# Patient Record
Sex: Male | Born: 1970 | Race: White | Hispanic: No | Smoking: Current every day smoker
Health system: Southern US, Community
[De-identification: ages and names within clinical notes are randomized; demographics above are authoritative.]

## PROBLEM LIST (undated history)

## (undated) DIAGNOSIS — J42 Unspecified chronic bronchitis: Secondary | ICD-10-CM

## (undated) DIAGNOSIS — M1990 Unspecified osteoarthritis, unspecified site: Secondary | ICD-10-CM

## (undated) DIAGNOSIS — R011 Cardiac murmur, unspecified: Secondary | ICD-10-CM

## (undated) DIAGNOSIS — I456 Pre-excitation syndrome: Secondary | ICD-10-CM

## (undated) DIAGNOSIS — K219 Gastro-esophageal reflux disease without esophagitis: Secondary | ICD-10-CM

## (undated) HISTORY — DX: Cardiac murmur, unspecified: R01.1

## (undated) HISTORY — PX: MOLE REMOVAL: SHX2046

## (undated) HISTORY — DX: Gastro-esophageal reflux disease without esophagitis: K21.9

---

## 2006-05-23 ENCOUNTER — Ambulatory Visit: Payer: Self-pay | Admitting: Family Medicine

## 2013-01-20 ENCOUNTER — Encounter: Payer: Self-pay | Admitting: Family Medicine

## 2013-01-20 ENCOUNTER — Ambulatory Visit (INDEPENDENT_AMBULATORY_CARE_PROVIDER_SITE_OTHER): Payer: 59 | Admitting: Family Medicine

## 2013-01-20 ENCOUNTER — Encounter (INDEPENDENT_AMBULATORY_CARE_PROVIDER_SITE_OTHER): Payer: Self-pay

## 2013-01-20 VITALS — BP 121/69 | HR 66 | Temp 98.6°F | Ht 74.0 in | Wt 217.0 lb

## 2013-01-20 DIAGNOSIS — N390 Urinary tract infection, site not specified: Secondary | ICD-10-CM

## 2013-01-20 DIAGNOSIS — R300 Dysuria: Secondary | ICD-10-CM

## 2013-01-20 DIAGNOSIS — A630 Anogenital (venereal) warts: Secondary | ICD-10-CM

## 2013-01-20 DIAGNOSIS — Z0000 Encounter for general adult medical examination without abnormal findings: Secondary | ICD-10-CM

## 2013-01-20 DIAGNOSIS — N451 Epididymitis: Secondary | ICD-10-CM

## 2013-01-20 DIAGNOSIS — N453 Epididymo-orchitis: Secondary | ICD-10-CM

## 2013-01-20 LAB — POCT UA - MICROSCOPIC ONLY
Casts, Ur, LPF, POC: NEGATIVE
Crystals, Ur, HPF, POC: NEGATIVE
Mucus, UA: NEGATIVE
RBC, urine, microscopic: NEGATIVE
Yeast, UA: NEGATIVE

## 2013-01-20 LAB — POCT UA - MICROALBUMIN: Microalbumin Ur, POC: NEGATIVE mg/L

## 2013-01-20 MED ORDER — CIPROFLOXACIN HCL 500 MG PO TABS: 500.0000 mg | ORAL_TABLET | Freq: Two times a day (BID) | ORAL | Status: DC

## 2013-01-20 MED ORDER — IMIQUIMOD 5 % EX CREA: TOPICAL_CREAM | CUTANEOUS | Status: DC

## 2013-01-20 NOTE — Progress Notes (Signed)
   Subjective:    Patient ID: Casey Brown, male    DOB: October 31, 1970, 42 y.o.   MRN: [id number]  HPI  This 42 y.o. male presents for evaluation of CPE.  He is having some skin tag on  His groin.  He has been having right testicular swelling and pain. He also is c/o Dysuria.  He has been having some dysuria for 3 days along with some fever. He has been having right testicular swelling..  Review of Systems No chest pain, SOB, HA, dizziness, vision change, N/V, diarrhea, constipation, dysuria, urinary urgency or frequency, myalgias, arthralgias or rash.     Objective:   Physical Exam  Vital signs noted  Well developed well nourished male.  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  Right teste swollen and tender. Left teste normal.  Penis normal w/o discharge and circumcised. Rectal - Declines Extremities - No edema. Neuro - Grossly intact.      Assessment & Plan:  Routine general medical examination at a health care facility - Plan: POCT CBC, CMP14+EGFR, PSA, total and free, Lipid panel, Thyroid Panel With TSH, POCT UA - Microalbumin, POCT UA - Microscopic Only  Dysuria - Plan: POCT UA - Microalbumin, POCT UA - Microscopic Only  UTI (lower urinary tract infection) - Plan: POCT UA - Microalbumin, POCT UA - Microscopic Only, ciprofloxacin (CIPRO) 500 MG tablet  Epididymitis - Cipro 500mg  one po bid x 2 weeks #28.  Explained needs to follow up in 2 weeks or prn.  Condyloma - Plan: imiquimod (ALDARA) 5 % cream  Apply 3 x week and then rest a week   Deatra Canter FNP

## 2013-01-20 NOTE — Patient Instructions (Signed)
Epididymitis  Epididymitis is a swelling (inflammation) of the epididymis. The epididymis is a cord-like structure along the back part of the testicle. Epididymitis is usually, but not always, caused by infection. This is usually a sudden problem beginning with chills, fever and pain behind the scrotum and in the testicle. There may be swelling and redness of the testicle.  DIAGNOSIS   Physical examination will reveal a tender, swollen epididymis. Sometimes, cultures are obtained from the urine or from prostate secretions to help find out if there is an infection or if the cause is a different problem. Sometimes, blood work is performed to see if your white blood cell count is elevated and if a germ (bacterial) or viral infection is present. Using this knowledge, an appropriate medicine which kills germs (antibiotic) can be chosen by your caregiver. A viral infection causing epididymitis will most often go away (resolve) without treatment.  HOME CARE INSTRUCTIONS   · Hot sitz baths for 20 minutes, 4 times per day, may help relieve pain.  · Only take over-the-counter or prescription medicines for pain, discomfort or fever as directed by your caregiver.  · Take all medicines, including antibiotics, as directed. Take the antibiotics for the full prescribed length of time even if you are feeling better.  · It is very important to keep all follow-up appointments.  SEEK IMMEDIATE MEDICAL CARE IF:   · You have a fever.  · You have pain not relieved with medicines.  · You have any worsening of your problems.  · Your pain seems to come and go.  · You develop pain, redness, and swelling in the scrotum and surrounding areas.  MAKE SURE YOU:   · Understand these instructions.  · Will watch your condition.  · Will get help right away if you are not doing well or get worse.  Document Released: 02/04/2000 Document Revised: 05/01/2011 Document Reviewed: 12/24/2008  Exit[employer]® Patient Information ©2014 [employer].

## 2013-01-20 NOTE — Addendum Note (Signed)
Addended by: Orma Render F on: 01/20/2013 11:55 AM   Modules accepted: Orders

## 2013-01-21 LAB — CBC WITH DIFFERENTIAL
Basophils Absolute: 0 10*3/uL (ref 0.0–0.2)
Basos: 1 %
Eos: 3 %
Eosinophils Absolute: 0.2 10*3/uL (ref 0.0–0.4)
HCT: 42 % (ref 37.5–51.0)
Hemoglobin: 14.4 g/dL (ref 12.6–17.7)
Immature Grans (Abs): 0.1 10*3/uL (ref 0.0–0.1)
Immature Granulocytes: 1 %
Lymphocytes Absolute: 2 10*3/uL (ref 0.7–3.1)
Lymphs: 33 %
MCH: 30.1 pg (ref 26.6–33.0)
MCHC: 34.3 g/dL (ref 31.5–35.7)
MCV: 88 fL (ref 79–97)
Monocytes Absolute: 0.6 10*3/uL (ref 0.1–0.9)
Monocytes: 9 %
Neutrophils Absolute: 3.2 10*3/uL (ref 1.4–7.0)
Neutrophils Relative %: 53 %
Platelets: 434 10*3/uL — ABNORMAL HIGH (ref 150–379)
RBC: 4.78 x10E6/uL (ref 4.14–5.80)
RDW: 12.7 % (ref 12.3–15.4)
WBC: 6.1 10*3/uL (ref 3.4–10.8)

## 2013-01-21 LAB — CMP14+EGFR
ALT: 23 IU/L (ref 0–44)
AST: 23 IU/L (ref 0–40)
Albumin/Globulin Ratio: 1.4 (ref 1.1–2.5)
Albumin: 4.3 g/dL (ref 3.5–5.5)
Alkaline Phosphatase: 83 IU/L (ref 39–117)
BUN/Creatinine Ratio: 8 — ABNORMAL LOW (ref 9–20)
BUN: 9 mg/dL (ref 6–24)
CO2: 25 mmol/L (ref 18–29)
Calcium: 9.8 mg/dL (ref 8.7–10.2)
Chloride: 104 mmol/L (ref 97–108)
Creatinine, Ser: 1.16 mg/dL (ref 0.76–1.27)
GFR calc Af Amer: 91 mL/min/{1.73_m2} (ref >59)
GFR calc non Af Amer: 78 mL/min/{1.73_m2} (ref >59)
Globulin, Total: 3.1 g/dL (ref 1.5–4.5)
Glucose: 90 mg/dL (ref 65–99)
Potassium: 4.8 mmol/L (ref 3.5–5.2)
Sodium: 144 mmol/L (ref 134–144)
Total Bilirubin: 0.3 mg/dL (ref 0.0–1.2)
Total Protein: 7.4 g/dL (ref 6.0–8.5)

## 2013-01-21 LAB — PSA, TOTAL AND FREE
PSA, Free Pct: 7.9 %
PSA, Free: 0.31 ng/mL
PSA: 3.9 ng/mL (ref 0.0–4.0)

## 2013-01-21 LAB — LIPID PANEL
Chol/HDL Ratio: 4.9 ratio units (ref 0.0–5.0)
Cholesterol, Total: 143 mg/dL (ref 100–199)
HDL: 29 mg/dL — ABNORMAL LOW (ref >39)
LDL Calculated: 87 mg/dL (ref 0–99)
Triglycerides: 133 mg/dL (ref 0–149)
VLDL Cholesterol Cal: 27 mg/dL (ref 5–40)

## 2013-01-21 LAB — THYROID PANEL WITH TSH
Free Thyroxine Index: 2.7 (ref 1.2–4.9)
T3 Uptake Ratio: 28 % (ref 24–39)
T4, Total: 9.7 ug/dL (ref 4.5–12.0)
TSH: 0.859 u[IU]/mL (ref 0.450–4.500)

## 2014-01-20 ENCOUNTER — Telehealth: Payer: Self-pay | Admitting: Family Medicine

## 2014-01-20 NOTE — Telephone Encounter (Signed)
Patient notified we have no available appointments for a physical before the end of the year.

## 2015-01-01 ENCOUNTER — Encounter: Payer: 59 | Admitting: Family Medicine

## 2015-01-07 ENCOUNTER — Encounter: Payer: 59 | Admitting: Family Medicine

## 2015-01-08 ENCOUNTER — Encounter: Payer: Self-pay | Admitting: Family Medicine

## 2018-02-04 ENCOUNTER — Encounter (HOSPITAL_COMMUNITY): Payer: Self-pay | Admitting: Emergency Medicine

## 2018-02-04 ENCOUNTER — Inpatient Hospital Stay (HOSPITAL_COMMUNITY)
Admission: EM | Admit: 2018-02-04 | Discharge: 2018-02-08 | DRG: 274 | Disposition: A | Discharge status: Home / Self Care | Payer: Managed Care, Other (non HMO) | Attending: Internal Medicine | Admitting: Internal Medicine

## 2018-02-04 ENCOUNTER — Other Ambulatory Visit: Payer: Self-pay

## 2018-02-04 ENCOUNTER — Emergency Department (INDEPENDENT_AMBULATORY_CARE_PROVIDER_SITE_OTHER): Payer: Managed Care, Other (non HMO)

## 2018-02-04 ENCOUNTER — Emergency Department (HOSPITAL_COMMUNITY): Payer: Managed Care, Other (non HMO)

## 2018-02-04 DIAGNOSIS — Z79899 Other long term (current) drug therapy: Secondary | ICD-10-CM | POA: Diagnosis not present

## 2018-02-04 DIAGNOSIS — I472 Ventricular tachycardia, unspecified: Secondary | ICD-10-CM

## 2018-02-04 DIAGNOSIS — R0602 Shortness of breath: Secondary | ICD-10-CM | POA: Diagnosis not present

## 2018-02-04 DIAGNOSIS — I456 Pre-excitation syndrome: Secondary | ICD-10-CM | POA: Diagnosis present

## 2018-02-04 DIAGNOSIS — I4891 Unspecified atrial fibrillation: Secondary | ICD-10-CM | POA: Diagnosis present

## 2018-02-04 DIAGNOSIS — Z7982 Long term (current) use of aspirin: Secondary | ICD-10-CM

## 2018-02-04 DIAGNOSIS — F1410 Cocaine abuse, uncomplicated: Secondary | ICD-10-CM | POA: Diagnosis not present

## 2018-02-04 DIAGNOSIS — R079 Chest pain, unspecified: Secondary | ICD-10-CM | POA: Diagnosis present

## 2018-02-04 DIAGNOSIS — J42 Unspecified chronic bronchitis: Secondary | ICD-10-CM | POA: Diagnosis not present

## 2018-02-04 DIAGNOSIS — I498 Other specified cardiac arrhythmias: Secondary | ICD-10-CM | POA: Diagnosis present

## 2018-02-04 DIAGNOSIS — F17210 Nicotine dependence, cigarettes, uncomplicated: Secondary | ICD-10-CM | POA: Diagnosis present

## 2018-02-04 DIAGNOSIS — R011 Cardiac murmur, unspecified: Secondary | ICD-10-CM | POA: Diagnosis present

## 2018-02-04 DIAGNOSIS — K219 Gastro-esophageal reflux disease without esophagitis: Secondary | ICD-10-CM | POA: Diagnosis not present

## 2018-02-04 HISTORY — DX: Pre-excitation syndrome: I45.6

## 2018-02-04 HISTORY — DX: Unspecified chronic bronchitis: J42

## 2018-02-04 HISTORY — DX: Unspecified osteoarthritis, unspecified site: M19.90

## 2018-02-04 LAB — CBC WITH DIFFERENTIAL/PLATELET
Abs Immature Granulocytes: 0.04 10*3/uL (ref 0.00–0.07)
Basophils Absolute: 0 10*3/uL (ref 0.0–0.1)
Basophils Relative: 1 %
Eosinophils Absolute: 0.2 10*3/uL (ref 0.0–0.5)
Eosinophils Relative: 4 %
HEMATOCRIT: 46.1 % (ref 39.0–52.0)
HEMOGLOBIN: 15.3 g/dL (ref 13.0–17.0)
Immature Granulocytes: 1 %
Lymphocytes Relative: 37 %
Lymphs Abs: 2.3 10*3/uL (ref 0.7–4.0)
MCH: 29.8 pg (ref 26.0–34.0)
MCHC: 33.2 g/dL (ref 30.0–36.0)
MCV: 89.9 fL (ref 80.0–100.0)
Monocytes Absolute: 0.9 10*3/uL (ref 0.1–1.0)
Monocytes Relative: 14 %
NRBC: 0 % (ref 0.0–0.2)
Neutro Abs: 2.7 10*3/uL (ref 1.7–7.7)
Neutrophils Relative %: 43 %
Platelets: 317 10*3/uL (ref 150–400)
RBC: 5.13 MIL/uL (ref 4.22–5.81)
RDW: 13.1 % (ref 11.5–15.5)
WBC: 6.3 10*3/uL (ref 4.0–10.5)

## 2018-02-04 LAB — I-STAT CHEM 8, ED
BUN: 12 mg/dL (ref 6–20)
CREATININE: 1.2 mg/dL (ref 0.61–1.24)
Calcium, Ion: 1.24 mmol/L (ref 1.15–1.40)
Chloride: 107 mmol/L (ref 98–111)
Glucose, Bld: 87 mg/dL (ref 70–99)
HCT: 46 % (ref 39.0–52.0)
Hemoglobin: 15.6 g/dL (ref 13.0–17.0)
Potassium: 4.1 mmol/L (ref 3.5–5.1)
Sodium: 141 mmol/L (ref 135–145)
TCO2: 24 mmol/L (ref 22–32)

## 2018-02-04 LAB — RAPID URINE DRUG SCREEN, HOSP PERFORMED
Amphetamines: NOT DETECTED
Barbiturates: NOT DETECTED
Benzodiazepines: NOT DETECTED
Cocaine: NOT DETECTED
Opiates: NOT DETECTED
Tetrahydrocannabinol: NOT DETECTED

## 2018-02-04 LAB — CBG MONITORING, ED: Glucose-Capillary: 90 mg/dL (ref 70–99)

## 2018-02-04 LAB — COMPREHENSIVE METABOLIC PANEL
ALK PHOS: 63 U/L (ref 38–126)
ALT: 21 U/L (ref 0–44)
AST: 26 U/L (ref 15–41)
Albumin: 4.1 g/dL (ref 3.5–5.0)
Anion gap: 6 (ref 5–15)
BUN: 13 mg/dL (ref 6–20)
CO2: 25 mmol/L (ref 22–32)
Calcium: 9.3 mg/dL (ref 8.9–10.3)
Chloride: 108 mmol/L (ref 98–111)
Creatinine, Ser: 1.15 mg/dL (ref 0.61–1.24)
GFR calc Af Amer: >60 mL/min (ref >60)
GFR calc non Af Amer: >60 mL/min (ref >60)
Glucose, Bld: 88 mg/dL (ref 70–99)
Potassium: 3.9 mmol/L (ref 3.5–5.1)
Sodium: 139 mmol/L (ref 135–145)
Total Bilirubin: 1.2 mg/dL (ref 0.3–1.2)
Total Protein: 7.6 g/dL (ref 6.5–8.1)

## 2018-02-04 LAB — TSH: TSH: 1.999 u[IU]/mL (ref 0.350–4.500)

## 2018-02-04 LAB — MAGNESIUM: Magnesium: 2.2 mg/dL (ref 1.7–2.4)

## 2018-02-04 LAB — I-STAT TROPONIN, ED
Troponin i, poc: 0 ng/mL (ref 0.00–0.08)
Troponin i, poc: 0.05 ng/mL (ref 0.00–0.08)

## 2018-02-04 LAB — ECHOCARDIOGRAM COMPLETE
Height: 74 in
Weight: 3440 oz

## 2018-02-04 LAB — TROPONIN I: Troponin I: <0.03 ng/mL (ref <0.03)

## 2018-02-04 MED ORDER — ETOMIDATE 2 MG/ML IV SOLN
INTRAVENOUS | Status: AC | PRN
  Administered 2018-02-04: 20 mg via INTRAVENOUS

## 2018-02-04 MED ORDER — ASPIRIN 81 MG PO CHEW
324.0000 mg | CHEWABLE_TABLET | Freq: Once | ORAL | Status: AC
  Administered 2018-02-04: 324 mg via ORAL
  Filled 2018-02-04: qty 4

## 2018-02-04 MED ORDER — FENTANYL CITRATE (PF) 100 MCG/2ML IJ SOLN: 25.0000 ug | Freq: Once | INTRAMUSCULAR | Status: DC

## 2018-02-04 MED ORDER — ADENOSINE 6 MG/2ML IV SOLN
INTRAVENOUS | Status: AC
  Filled 2018-02-04: qty 8

## 2018-02-04 MED ORDER — FENTANYL CITRATE (PF) 100 MCG/2ML IJ SOLN
INTRAMUSCULAR | Status: AC | PRN
  Administered 2018-02-04: 25 ug via INTRAVENOUS

## 2018-02-04 MED ORDER — ETOMIDATE 2 MG/ML IV SOLN: 20.0000 mg | Freq: Once | INTRAVENOUS | Status: DC

## 2018-02-04 MED ORDER — FENTANYL CITRATE (PF) 100 MCG/2ML IJ SOLN
INTRAMUSCULAR | Status: AC
  Filled 2018-02-04: qty 2

## 2018-02-04 MED ORDER — SODIUM CHLORIDE 0.9 % IV BOLUS: 500.0000 mL | Freq: Once | INTRAVENOUS | Status: DC

## 2018-02-04 MED ORDER — SODIUM CHLORIDE 0.9 % IV BOLUS (SEPSIS)
1000.0000 mL | Freq: Once | INTRAVENOUS | Status: AC
  Administered 2018-02-04: 1000 mL via INTRAVENOUS

## 2018-02-04 MED ORDER — ETOMIDATE 2 MG/ML IV SOLN
INTRAVENOUS | Status: AC
  Filled 2018-02-04: qty 10

## 2018-02-04 MED ORDER — FENTANYL CITRATE (PF) 100 MCG/2ML IJ SOLN
25.0000 ug | INTRAMUSCULAR | Status: DC | PRN
  Administered 2018-02-06 – 2018-02-07 (×2): 25 ug via INTRAVENOUS
  Filled 2018-02-04 (×2): qty 2

## 2018-02-04 MED ORDER — SODIUM CHLORIDE 0.9 % IV SOLN: INTRAVENOUS | Status: AC | PRN

## 2018-02-04 NOTE — Progress Notes (Signed)
Patient arrived from [facility]. Some chest soreness after cardioversion. Otherwise, no discomfort. 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

## 2018-02-04 NOTE — ED Triage Notes (Signed)
PT c/o middle, upper neck pain that started last night with SOB. EKG done with high heart rate.

## 2018-02-04 NOTE — H&P (Signed)
Cardiology Consultation:   Patient ID: Casey [name] [place] MRN: [id number]; DOB: 03/11/1970  Admit date: 02/04/2018 Date of Consult: 02/04/2018  Primary Care Provider: Junie [name], Christy A, FNP Primary Cardiologist: New Primary Electrophysiologist:  New   Patient Profile:   Casey [name] Junio is a 47 y.o. male with a hx of substance abuse who is being seen today for the evaluation of chest pain at the request of Dr Casey [name].  History of Present Illness:   Mr. Casey [name] 47 yo male with no significant PMH but history of cocaine use presents with chest pain. He reporst long history of intermittent chest pains and palpitations, usually infrequent and lasting just a few minutes. Starting last night around 8pm had severe symptoms of palpitations and chest tightness lasting several hours at a time, much more intense and extended in duration compared to prior episodes.   In ER found to be in wide complex rhythm, he was cardioverted by ER staff   Trop neg K 4.1 Cr 1.2 WBC 6.3 Hgb 15.3 Plt 317 Mg pending Initial EKG  Past Medical History:  Diagnosis Date  . GERD (gastroesophageal reflux disease)   . Heart murmur     Past Surgical History:  Procedure Laterality Date  . MOLE REMOVAL       Inpatient Medications: Scheduled Meds: . adenosine      . etomidate  20 mg Intravenous Once  . fentaNYL (SUBLIMAZE) injection  25 mcg Intravenous Once   Continuous Infusions: . sodium chloride     PRN Meds: fentaNYL (SUBLIMAZE) injection  Allergies:   No Known Allergies  Social History:   Social History   Socioeconomic History  . Marital status: Single    Spouse name: Not on file  . Number of children: Not on file  . Years of education: Not on file  . Highest education level: Not on file  Occupational History  . Not on file  Social Needs  . Financial resource strain: Not on file  . Food insecurity:    Worry: Not on file    Inability: Not on file  . Transportation needs:    Medical: Not on file   Non-medical: Not on file  Tobacco Use  . Smoking status: Current Every Day Smoker    Packs/day: 0.50    Types: Cigarettes    Start date: 01/20/1993  . Smokeless tobacco: Never Used  Substance and Sexual Activity  . Alcohol use: Yes    Alcohol/week: 12.0 standard drinks    Types: 12 Standard drinks or equivalent per week    Comment: daily 4 beers  . Drug use: Yes    Types: Cocaine    Comment: cociane 2 weeks ago  . Sexual activity: Not on file  Lifestyle  . Physical activity:    Days per week: Not on file    Minutes per session: Not on file  . Stress: Not on file  Relationships  . Social connections:    Talks on phone: Not on file    Gets together: Not on file    Attends religious service: Not on file    Active member of club or organization: Not on file    Attends meetings of clubs or organizations: Not on file    Relationship status: Not on file  . Intimate partner violence:    Fear of current or ex partner: Not on file    Emotionally abused: Not on file    Physically abused: Not on file    Forced sexual activity: Not on file  Other Topics Concern  . Not on file  Social History Narrative  . Not on file    Family History:   Reports history of "heart disease" in aunts and cousing, no 1st degree relatives. No FH of young onset heart disease.   ROS:  Please see the history of present illness.  All other ROS reviewed and negative.     Physical Exam/Data:   Vitals:   02/04/18 1015 02/04/18 1017 02/04/18 1030 02/04/18 1045  BP: 126/87 126/87 119/79 111/83  Pulse: 71 60 66 65  Resp: 15 18 19 17   Temp:      TempSrc:      SpO2: 100% 100% 100% 100%  Weight:      Height:       No intake or output data in the 24 hours ending 02/04/18 1101 Filed Weights   02/04/18 0956  Weight: 97.5 kg   Body mass index is 27.6 kg/m.  General:  Well nourished, well developed, in no acute distress HEENT: normal Lymph: no adenopathy Neck: no JVD Endocrine:  No  thryomegaly Vascular: No carotid bruits; FA pulses 2+ bilaterally without bruits  Cardiac:  normal S1, S2; RRR; no murmur Lungs:  clear to auscultation bilaterally, no wheezing, rhonchi or rales  Abd: soft, nontender, no hepatomegaly  Ext: no edema Musculoskeletal:  No deformities, BUE and BLE strength normal and equal Skin: warm and dry  Neuro:  CNs 2-12 intact, no focal abnormalities noted Psych:  Normal affect     Laboratory Data:  Chemistry Recent Labs  Lab 02/04/18 0957  NA 141  K 4.1  CL 107  GLUCOSE 87  BUN 12  CREATININE 1.20    No results for input(s): PROT, ALBUMIN, AST, ALT, ALKPHOS, BILITOT in the last 168 hours. Hematology Recent Labs  Lab 02/04/18 0957  HGB 15.6  HCT 46.0   Cardiac EnzymesNo results for input(s): TROPONINI in the last 168 hours.  Recent Labs  Lab 02/04/18 0955  TROPIPOC 0.00    BNPNo results for input(s): BNP, PROBNP in the last 168 hours.  DDimer No results for input(s): DDIMER in the last 168 hours.  Radiology/Studies:  Dg Chest Portable 1 View  Result Date: 02/04/2018 CLINICAL DATA:  Chest pain EXAM: PORTABLE CHEST 1 VIEW COMPARISON:  None. FINDINGS: Pacer pads overlie the left chest. Normal heart size. Normal mediastinal contour. No pneumothorax. No pleural effusion. Lungs appear clear, with no acute consolidative airspace disease and no pulmonary edema. IMPRESSION: No active disease. Electronically Signed   By: Casey [name] M.D.   On: 02/04/2018 10:28    Assessment and Plan:   1. Wide complex tachycardia/WPW/Chest pain -presented with wide complex irregular tachycardia. 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 Looks like he presented with afib and WPW - will contact EP staff but would anticipate transfer to [facility] for EP evaluation and possible ablation.  - Avoid av nodal agents.  - CHADS2Vasc score of  0 based on known medical history, will not start anticoag. Severe symptoms <24 hour duration.   2. Substance abuse - from ER notes last cocaine use 2 weeks ago, I did not confirm with patient as several family were present at time of my interview - f/u [facility]. Avoid beta blockers with cocaine and WPW.      For questions or updates, please contact [facility] Please consult [email or web address] for contact info under  Joanie Coddington, MD  02/04/2018 11:01 AM

## 2018-02-04 NOTE — Progress Notes (Signed)
*  PRELIMINARY RESULTS* Echocardiogram 2D Echocardiogram has been performed.  Casey [name], Casey [name] 02/04/2018, 3:40 PM

## 2018-02-04 NOTE — ED Provider Notes (Signed)
[facility] EMERGENCY DEPARTMENT Provider Note   CSN: [id number] Arrival date & time: 02/04/18  4098     History   Chief Complaint Chief Complaint  Patient presents with  . Chest Pain    HPI Casey [name] is a 47 y.o. male.  Patient presents with palpitations and chest pain since last night.  No history of similar.  Family history of cardiac.  Patient smokes cigarettes and does use cocaine last used 2 weeks ago.  Symptoms overall persistent.  No syncope.  No fevers chills or cough.     Past Medical History:  Diagnosis Date  . GERD (gastroesophageal reflux disease)   . Heart murmur     There are no active problems to display for this patient.   Past Surgical History:  Procedure Laterality Date  . MOLE REMOVAL          Home Medications    Prior to Admission medications   Not on File    Family History History reviewed. No pertinent family history.  Social History Social History   Tobacco Use  . Smoking status: Current Every Day Smoker    Packs/day: 0.50    Types: Cigarettes    Start date: 01/20/1993  . Smokeless tobacco: Never Used  Substance Use Topics  . Alcohol use: Yes    Alcohol/week: 12.0 standard drinks    Types: 12 Standard drinks or equivalent per week    Comment: daily 4 beers  . Drug use: Yes    Types: Cocaine    Comment: cociane 2 weeks ago     Allergies   Patient has no known allergies.   Review of Systems Review of Systems  Unable to perform ROS: Acuity of condition     Physical Exam Updated Vital Signs BP 119/79   Pulse 66   Temp (!) 97.4 F (36.3 C) (Oral)   Resp 19   Ht 6\' 2"  (1.88 m)   Wt 97.5 kg   SpO2 100%   BMI 27.60 kg/m   Physical Exam Vitals signs and nursing note reviewed.  Constitutional:      Appearance: He is well-developed. He is diaphoretic.  HENT:     Head: Normocephalic and atraumatic.  Eyes:     General:        Right eye: No discharge.        Left eye: No discharge.   Conjunctiva/sclera: Conjunctivae normal.  Neck:     Musculoskeletal: Normal range of motion and neck supple.     Trachea: No tracheal deviation.  Cardiovascular:     Rate and Rhythm: Regular rhythm. Tachycardia present.  Pulmonary:     Effort: Pulmonary effort is normal.     Breath sounds: Normal breath sounds.  Abdominal:     General: There is no distension.     Palpations: Abdomen is soft.     Tenderness: There is no abdominal tenderness. There is no guarding.  Skin:    General: Skin is warm.     Findings: No rash.  Neurological:     Mental Status: He is alert and oriented to person, place, and time.      ED Treatments / Results  Labs (all labs ordered are listed, but only abnormal results are displayed) Labs Reviewed  CBC  CBC WITH DIFFERENTIAL/PLATELET  COMPREHENSIVE METABOLIC PANEL  TROPONIN I  MAGNESIUM  RAPID URINE DRUG SCREEN, HOSP PERFORMED  CBC WITH DIFFERENTIAL/PLATELET  I-STAT CHEM 8, ED  CBG MONITORING, ED  I-STAT TROPONIN, ED  I-STAT TROPONIN,  ED    EKG EKG Interpretation  Date/Time:  Monday February 04 2018 09:44:37 EST Ventricular Rate:  166 PR Interval:  74 QRS Duration: 140 QT Interval:  330 QTC Calculation: 548 R Axis:   95 Text Interpretation:  Sinus tachycardia with short PR with Premature supraventricular complexes and Fusion complexes versus Ventricular tachycardia Rightward axis Left ventricular hypertrophy with QRS widening and repolarization abnormality Cannot rule out Septal infarct , age undetermined Abnormal ECG Confirmed by Blane OharaZavitz, Jahkeem Kurka [phone]([id number]) on 02/04/2018 10:21:19 AM   Radiology Dg Chest Portable 1 View  Result Date: 02/04/2018 CLINICAL DATA:  Chest pain EXAM: PORTABLE CHEST 1 VIEW COMPARISON:  None. FINDINGS: Pacer pads overlie the left chest. Normal heart size. Normal mediastinal contour. No pneumothorax. No pleural effusion. Lungs appear clear, with no acute consolidative airspace disease and no pulmonary edema. IMPRESSION:  No active disease. Electronically Signed   By: Delbert PhenixJason A Poff M.D.   On: 02/04/2018 10:28    Procedures .Critical Care Performed by: Blane OharaZavitz, Jeray Shugart, MD Authorized by: Blane OharaZavitz, Niza Soderholm, MD   Critical care provider statement:    Critical care time (minutes):  50   Critical care start time:  02/04/2018 9:50 AM   Critical care end time:  02/04/2018 10:40 AM   Critical care time was exclusive of:  Separately billable procedures and treating other patients and teaching time   Critical care was necessary to treat or prevent imminent or life-threatening deterioration of the following conditions:  Cardiac failure   Critical care was time spent personally by me on the following activities:  Discussions with consultants, evaluation of patient's response to treatment, examination of patient, ordering and performing treatments and interventions, ordering and review of laboratory studies, ordering and review of radiographic studies, pulse oximetry, re-evaluation of patient's condition, obtaining history from patient or surrogate and review of old charts   I assumed direction of critical care for this patient from another provider in my specialty: no   .Cardioversion Date/Time: 02/04/2018 10:41 AM Performed by: Blane OharaZavitz, Fedrick Cefalu, MD Authorized by: Blane OharaZavitz, Robynn Marcel, MD   Consent:    Consent obtained:  Verbal and written   Consent given by:  Patient   Risks discussed:  Cutaneous burn, induced arrhythmia, death and pain   Alternatives discussed:  No treatment Pre-procedure details:    Cardioversion basis:  Emergent   Rhythm:  Ventricular tachycardia   Electrode placement:  Anterior-posterior Patient sedated: Yes. Refer to sedation procedure documentation for details of sedation.  Attempt one:    Cardioversion mode:  Synchronous   Waveform:  Biphasic   Shock (Joules):  200   Shock outcome:  Conversion to normal sinus rhythm Post-procedure details:    Patient status:  Alert   Patient tolerance of  procedure:  Tolerated well, no immediate complications .Sedation Date/Time: 02/04/2018 10:42 AM Performed by: Blane OharaZavitz, Clemente Dewey, MD Authorized by: Blane OharaZavitz, Guenther Dunshee, MD   Consent:    Consent obtained:  Verbal   Consent given by:  Patient   Risks discussed:  Allergic reaction, dysrhythmia, inadequate sedation, nausea, prolonged hypoxia resulting in organ damage, prolonged sedation necessitating reversal, respiratory compromise necessitating ventilatory assistance and intubation and vomiting   Alternatives discussed:  Analgesia without sedation, anxiolysis and regional anesthesia Universal protocol:    Procedure explained and questions answered to patient or proxy's satisfaction: yes     Relevant documents present and verified: yes     Test results available and properly labeled: yes     Imaging studies available: yes     Required blood products,  implants, devices, and special equipment available: yes     Site/side marked: yes     Immediately prior to procedure a time out was called: yes     Patient identity confirmation method:  Verbally with patient Indications:    Procedure necessitating sedation performed by:  Physician performing sedation Pre-sedation assessment:    Time since last food or drink:  4 hr   NPO status caution: urgency dictates proceeding with non-ideal NPO status     ASA classification: class 1 - normal, healthy patient     Neck mobility: normal     Mouth opening:  3 or more finger widths   Thyromental distance:  4 finger widths   Mallampati score:  I - soft palate, uvula, fauces, pillars visible   Pre-sedation assessments completed and reviewed: airway patency, cardiovascular function, hydration status, mental status, nausea/vomiting, pain level, respiratory function and temperature     Pre-sedation assessment completed:  02/04/2018 9:30 AM Immediate pre-procedure details:    Reassessment: Patient reassessed immediately prior to procedure     Reviewed: vital signs,  relevant labs/tests and NPO status     Verified: bag valve mask available, emergency equipment available, intubation equipment available, IV patency confirmed, oxygen available and suction available   Procedure details (see MAR for exact dosages):    Preoxygenation:  Nasal cannula   Sedation:  Propofol   Intra-procedure monitoring:  Blood pressure monitoring, cardiac monitor, continuous pulse oximetry, frequent LOC assessments, frequent vital sign checks and continuous capnometry   Intra-procedure events: none     Total Provider sedation time (minutes):  15 Post-procedure details:    Post-sedation assessment completed:  02/04/2018 10:30 AM   Attendance: Constant attendance by certified staff until patient recovered     Recovery: Patient returned to pre-procedure baseline     Post-sedation assessments completed and reviewed: airway patency, cardiovascular function, hydration status, mental status, nausea/vomiting, pain level, respiratory function and temperature     Patient is stable for discharge or admission: yes     Patient tolerance:  Tolerated well, no immediate complications   (including critical care time)  Medications Ordered in ED Medications  adenosine (ADENOCARD) 6 MG/2ML injection (  Not Given 02/04/18 1010)  fentaNYL (SUBLIMAZE) injection 25 mcg (has no administration in time range)  etomidate (AMIDATE) injection 20 mg ( Intravenous Not Given 02/04/18 1021)  fentaNYL (SUBLIMAZE) injection 25 mcg ( Intravenous Not Given 02/04/18 1022)  sodium chloride 0.9 % bolus 1,000 mL (1,000 mLs Intravenous New Bag/Given 02/04/18 1022)  sodium chloride 0.9 % bolus 500 mL (500 mLs Intravenous Not Given 02/04/18 1030)  aspirin chewable tablet 324 mg (324 mg Oral Given 02/04/18 1044)  fentaNYL (SUBLIMAZE) injection (25 mcg Intravenous Given 02/04/18 0958)  etomidate (AMIDATE) injection (20 mg Intravenous Given 02/04/18 1004)  0.9 %  sodium chloride infusion (1,000 mLs Intravenous New  Bag/Given 02/04/18 0955)     Initial Impression / Assessment and Plan / ED Course  I have reviewed the triage vital signs and the nursing notes.  Pertinent labs & imaging results that were available during my care of the patient were reviewed by me and considered in my medical decision making (see chart for details).    Patient presented chest pain palpitations and significant elevated heart rate.  Patient's heart rate 160s, wide QRS, no old EKG to review.  Patient does have cocaine history but no known cardiac disease.  Patient taken immediately to a room, discussed risks and benefits of cardioversion in the case this may  be ventricular tachycardia/aberrancy from WPW or other dangerous rhythm.  Patient cardioverted on one attempts and observe closely in the ER.  Discussed with cardiology for consult to assist with admission/disposition.  Aspirin ordered once patient more awake.  The patients results and plan were reviewed and discussed.   Any x-rays performed were independently reviewed by myself.   Differential diagnosis were considered with the presenting HPI.  Medications  adenosine (ADENOCARD) 6 MG/2ML injection (  Not Given 02/04/18 1010)  fentaNYL (SUBLIMAZE) injection 25 mcg (has no administration in time range)  etomidate (AMIDATE) injection 20 mg ( Intravenous Not Given 02/04/18 1021)  fentaNYL (SUBLIMAZE) injection 25 mcg ( Intravenous Not Given 02/04/18 1022)  sodium chloride 0.9 % bolus 1,000 mL (1,000 mLs Intravenous New Bag/Given 02/04/18 1022)  sodium chloride 0.9 % bolus 500 mL (500 mLs Intravenous Not Given 02/04/18 1030)  aspirin chewable tablet 324 mg (324 mg Oral Given 02/04/18 1044)  fentaNYL (SUBLIMAZE) injection (25 mcg Intravenous Given 02/04/18 0958)  etomidate (AMIDATE) injection (20 mg Intravenous Given 02/04/18 1004)  0.9 %  sodium chloride infusion (1,000 mLs Intravenous New Bag/Given 02/04/18 0955)    Vitals:   02/04/18 1009 02/04/18 1015 02/04/18 1017  02/04/18 1030  BP: (!) 116/92 126/87 126/87 119/79  Pulse: 73 71 60 66  Resp: 19 15 18 19   Temp:      TempSrc:      SpO2: 100% 100% 100% 100%  Weight:      Height:        Final diagnoses:  Wide QRS ventricular tachycardia (HCC)  Acute chest pain    Admission/ observation were discussed with the admitting physician, patient and/or family and they are comfortable with the plan.    Final Clinical Impressions(s) / ED Diagnoses   Final diagnoses:  Wide QRS ventricular tachycardia (HCC)  Acute chest pain    ED Discharge Orders    None       Blane Ohara, MD 02/06/18 [phone]

## 2018-02-04 NOTE — ED Notes (Signed)
[facility]

## 2018-02-04 NOTE — ED Notes (Addendum)
Successful synchronized cardioversion at 200 J without complications.  Pt fully awake immediately post procedure.

## 2018-02-04 NOTE — ED Notes (Signed)
Per lab tech, ordered blood work not crossing over to [employer]. Re entered orders.

## 2018-02-04 NOTE — ED Notes (Signed)
Date and time results received: 02/04/18 1333  Test: Troponin Critical Value: 0.05  Name of Provider Notified: Dr. Jodi MourningZavitz  Orders Received? Or Actions Taken? See orders

## 2018-02-05 ENCOUNTER — Encounter (HOSPITAL_COMMUNITY): Payer: Self-pay | Admitting: Nurse Practitioner

## 2018-02-05 DIAGNOSIS — I456 Pre-excitation syndrome: Secondary | ICD-10-CM | POA: Diagnosis present

## 2018-02-05 MED ORDER — FLECAINIDE ACETATE 100 MG PO TABS
100.0000 mg | ORAL_TABLET | Freq: Two times a day (BID) | ORAL | Status: DC
  Administered 2018-02-05 – 2018-02-07 (×4): 100 mg via ORAL
  Filled 2018-02-05 (×5): qty 1

## 2018-02-05 MED ORDER — FLECAINIDE ACETATE 50 MG PO TABS
50.0000 mg | ORAL_TABLET | Freq: Once | ORAL | Status: AC
  Administered 2018-02-05: 50 mg via ORAL
  Filled 2018-02-05: qty 1

## 2018-02-05 MED ORDER — FLECAINIDE ACETATE 50 MG PO TABS
50.0000 mg | ORAL_TABLET | Freq: Two times a day (BID) | ORAL | Status: DC
  Filled 2018-02-05: qty 1

## 2018-02-05 MED ORDER — ZOLPIDEM TARTRATE 5 MG PO TABS
5.0000 mg | ORAL_TABLET | Freq: Once | ORAL | Status: AC
  Administered 2018-02-05: 5 mg via ORAL
  Filled 2018-02-05: qty 1

## 2018-02-05 MED ORDER — METOPROLOL TARTRATE 50 MG PO TABS
50.0000 mg | ORAL_TABLET | Freq: Two times a day (BID) | ORAL | Status: DC
  Administered 2018-02-05 – 2018-02-07 (×5): 50 mg via ORAL
  Filled 2018-02-05 (×5): qty 1

## 2018-02-05 NOTE — Consult Note (Addendum)
ELECTROPHYSIOLOGY CONSULT NOTE    Patient ID: Casey Brown MRN: [id number], DOB/AGE: Apr 04, 1970 47 y.o.  Admit date: 02/04/2018 Date of Consult: 02/05/2018  Primary Physician: Patient, No Pcp Per Primary Cardiologist: Branch Electrophysiologist: Ladona Ridgelaylor  Patient Profile: Casey Brown is a 47 y.o. male with a history of substance abuse and palpitations who is being seen today for the evaluation of AF and WPW  at the request of Dr Wyline MoodBranch.  HPI:  Casey Brown is a 47 y.o. male with prior cocaine abuse. About 2 years ago, he had an episode of tachycardia and was told at that point he had WPW. He has not been seen by cardiology since.  He presented to the ER at [facility] for evaluation of ongoing tachypalpitations.  His episode started 02/03/18 around 8PM and persisted.  It was associated with shortness of breath and intermittent chest pain.  Because of persistent symptoms, he presented to the ER. There he was found to be in AF with RVR with variable degrees of pre-excitation and was cardioverted by ER staff.  Dr Wyline MoodBranch discussed with Dr Ladona Ridgelaylor and he was transferred [facility] for EP evaluation.  This morning, he is complaining of chest pain post cardioversion.  He denies shortness of breath or other symptoms.   He does have a history of cocaine abuse. Drug screen negative this admission.   Echocardiogram yesterday demonstrated EF 55-60%, moderate LVH, no RWMA, LA 31  He denies PND, orthopnea, nausea, vomiting, dizziness, syncope, edema, weight gain, or early satiety.  Past Medical History:  Diagnosis Date  . Arthritis    "ankles" (02/04/2018)  . Chronic bronchitis (HCC)   . GERD (gastroesophageal reflux disease)   . Heart murmur   . WPW (Wolff-Parkinson-White syndrome)      Surgical History:  Past Surgical History:  Procedure Laterality Date  . MOLE REMOVAL Left ~ 1980   "back"     No medications prior to admission.    Inpatient Medications:  . etomidate  20 mg Intravenous  Once  . fentaNYL (SUBLIMAZE) injection  25 mcg Intravenous Once  . flecainide  100 mg Oral Q12H  . flecainide  50 mg Oral Once  . metoprolol tartrate  50 mg Oral BID    Allergies: No Known Allergies  Social History   Socioeconomic History  . Marital status: Divorced    Spouse name: Not on file  . Number of children: Not on file  . Years of education: Not on file  . Highest education level: Not on file  Occupational History  . Not on file  Social Needs  . Financial resource strain: Not on file  . Food insecurity:    Worry: Not on file    Inability: Not on file  . Transportation needs:    Medical: Not on file    Non-medical: Not on file  Tobacco Use  . Smoking status: Current Every Day Smoker    Packs/day: 0.50    Years: 33.00    Pack years: 16.50    Types: Cigarettes    Start date: 01/20/1993  . Smokeless tobacco: Never Used  Substance and Sexual Activity  . Alcohol use: Yes    Alcohol/week: 12.0 standard drinks    Types: 12 Cans of beer per week    Comment: 02/04/2018 "plus liquor on special occasions"  . Drug use: Yes    Types: Cocaine, Marijuana    Comment: 02/04/2018 "cocaine 2 wks ago; marijuana on special occasions; holidays; birthdays"  . Sexual activity: Not Currently  Lifestyle  . Physical activity:    Days per week: Not on file    Minutes per session: Not on file  . Stress: Not on file  Relationships  . Social connections:    Talks on phone: Not on file    Gets together: Not on file    Attends religious service: Not on file    Active member of club or organization: Not on file    Attends meetings of clubs or organizations: Not on file    Relationship status: Not on file  . Intimate partner violence:    Fear of current or ex partner: Not on file    Emotionally abused: Not on file    Physically abused: Not on file    Forced sexual activity: Not on file  Other Topics Concern  . Not on file  Social History Narrative  . Not on file    Family  History: no premature CAD   Review of Systems: All other systems reviewed and are otherwise negative except as noted above.  Physical Exam: Vitals:   02/04/18 1745 02/04/18 1931 02/04/18 2341 02/05/18 0551  BP: 108/87 121/73 112/75 118/83  Pulse: (!) 59 67 66 78  Resp: 20 18 18 18   Temp:  97.7 F (36.5 C)  98 F (36.7 C)  TempSrc:  Oral  Oral  SpO2: 100% 100% 100% 100%  Weight:  102.6 kg  102.7 kg  Height:  6\' 2"  (1.88 m)      GEN- The patient is well appearing, alert and oriented x 3 today.   HEENT: normocephalic, atraumatic; sclera clear, conjunctiva pink; hearing intact; oropharynx clear; neck supple Lungs- Clear to ausculation bilaterally, normal work of breathing.  No wheezes, rales, rhonchi Heart- Regular rate and rhythm  GI- soft, non-tender, non-distended, bowel sounds present Extremities- no clubbing, cyanosis, or edema MS- no significant deformity or atrophy Skin- multiple tattoos, warm and dry, no rash or lesion Psych- euthymic mood, full affect Neuro- strength and sensation are intact  Labs:   Lab Results  Component Value Date   WBC 6.3 02/04/2018   HGB 15.3 02/04/2018   HCT 46.1 02/04/2018   MCV 89.9 02/04/2018   PLT 317 02/04/2018    Recent Labs  Lab 02/04/18 1014  NA 139  K 3.9  CL 108  CO2 25  BUN 13  CREATININE 1.15  CALCIUM 9.3  PROT 7.6  BILITOT 1.2  ALKPHOS 63  ALT 21  AST 26  GLUCOSE 88      Radiology/Studies: Dg Chest Portable 1 View  Result Date: 02/04/2018 CLINICAL DATA:  Chest pain EXAM: PORTABLE CHEST 1 VIEW COMPARISON:  None. FINDINGS: Pacer pads overlie the left chest. Normal heart size. Normal mediastinal contour. No pneumothorax. No pleural effusion. Lungs appear clear, with no acute consolidative airspace disease and no pulmonary edema. IMPRESSION: No active disease. Electronically Signed   By: Delbert Phenix M.D.   On: 02/04/2018 10:28    BMW:UXLKG rhythm, rate 65, WPW (personally reviewed)  TELEMETRY: sinus rhythm  (personally reviewed)  Assessment/Plan: 1.  WPW/AF with RVR The patient has WPW as well as AF with RVR. Treatment options reviewed with patient today including medical therapy vs ablation.  Concern is that his pathway is near AVN Will plan for Flecainide and Metoprolol and follow EKG's while here If medical therapy fails, can consider ablation Will need to caution against cocaine use with BB therapy  2.  Substance abuse Cessation advised   For questions or updates, please contact  [facility] Please consult [email or web address] for contact info under Cardiology/STEMI.  Signed, Gypsy Balsam, NP 02/05/2018 8:42 AM  EP Attending  Patient seen and examined. Agree with above with minimal modification. The patient has pre-excitated atrial fib with a RVR, as well as a h/o substance abuse. He was cardioverted back to NSR. Review of his ECG demonstrates WPW syndrome with positive delta waves and transition from negative to positive in lead V3. AVL and AVR are both negative suggesting an Anteroseptal pathway. I have outlined the treatment options both for ablation and risk of heart block and need for PPM, and medical therapy with a beta blocker and flecainide. He would like to try medical therapy first. He will be encouraged going forward to avoid Cocaine.  Leonia Reeves.D.

## 2018-02-06 MED ORDER — ACETAMINOPHEN 325 MG PO TABS
650.0000 mg | ORAL_TABLET | ORAL | Status: DC | PRN
  Administered 2018-02-06: 650 mg via ORAL
  Filled 2018-02-06: qty 2

## 2018-02-06 NOTE — Progress Notes (Signed)
HR decreased to 34 bpm while pt asleep. RN went to room to awaken patient. HR now sustaining in the 50s. Will continue to monitor.

## 2018-02-06 NOTE — Progress Notes (Addendum)
Electrophysiology Rounding Note  Patient Name: Casey Brown Date of Encounter: 02/06/2018  Primary Cardiologist: Wyline MoodBranch Electrophysiologist: Ladona Ridgelaylor   Subjective   The patient is doing well today.  At this time, the patient denies chest pain, shortness of breath, or any new concerns.  Inpatient Medications    Scheduled Meds: . etomidate  20 mg Intravenous Once  . fentaNYL (SUBLIMAZE) injection  25 mcg Intravenous Once  . flecainide  100 mg Oral Q12H  . metoprolol tartrate  50 mg Oral BID   Continuous Infusions: . sodium chloride     PRN Meds: fentaNYL (SUBLIMAZE) injection   Vital Signs    Vitals:   02/06/18 0516 02/06/18 0636 02/06/18 1004 02/06/18 1159  BP: 105/64  130/75 106/68  Pulse: (!) 58 (!) 55 60 (!) 57  Resp: 18   19  Temp: 98.3 F (36.8 C)   (!) 97.5 F (36.4 C)  TempSrc: Oral   Oral  SpO2: 99%   99%  Weight: 104 kg     Height:        Intake/Output Summary (Last 24 hours) at 02/06/2018 1401 Last data filed at 02/06/2018 1325 Gross per 24 hour  Intake 1260 ml  Output 1950 ml  Net -690 ml   Filed Weights   02/04/18 1931 02/05/18 0551 02/06/18 0516  Weight: 102.6 kg 102.7 kg 104 kg    Physical Exam    GEN- The patient is well appearing, alert and oriented x 3 today.   Head- normocephalic, atraumatic Eyes-  Sclera clear, conjunctiva pink Ears- hearing intact Oropharynx- clear Neck- supple Lungs- Clear to ausculation bilaterally, normal work of breathing Heart- Regular rate and rhythm  GI- soft, NT, ND, + BS Extremities- no clubbing, cyanosis, or edema Skin- no rash or lesion Psych- euthymic mood, full affect Neuro- strength and sensation are intact  Labs    CBC Recent Labs    02/04/18 0957 02/04/18 1014  WBC  --  6.3  NEUTROABS  --  2.7  HGB 15.6 15.3  HCT 46.0 46.1  MCV  --  89.9  PLT  --  317   Basic Metabolic Panel Recent Labs    16/11/9610/16/19 0957 02/04/18 1014  NA 141 139  K 4.1 3.9  CL 107 108  CO2  --  25    GLUCOSE 87 88  BUN 12 13  CREATININE 1.20 1.15  CALCIUM  --  9.3  MG  --  2.2   Liver Function Tests Recent Labs    02/04/18 1014  AST 26  ALT 21  ALKPHOS 63  BILITOT 1.2  PROT 7.6  ALBUMIN 4.1   Cardiac Enzymes Recent Labs    02/04/18 1014  TROPONINI <0.03   Thyroid Function Tests Recent Labs    02/04/18 1106  TSH 1.999    Telemetry    SR (personally reviewed)  Radiology    No results found.   Patient Profile     Casey Brown is a 47 y.o. male admitted with WPW and AF  Assessment & Plan    1.  WPW Flecainide and Metoprolol started yesterday Delta wave still present - likely anteroseptal pathway Will monitor throughout the day today If delta wave still present tomorrow, consider RFCA  2.  Substance abuse Cocaine cessation advised   For questions or updates, please contact [facility] Please consult [email or web address] for contact info under Cardiology/STEMI.  Signed, Gypsy BalsamAmber Seiler, NP  02/06/2018, 2:01 PM   EP Attending  Agree with above. See my note.  If delta wave is not gone, we will consider catheter ablation tomorrow.  Leonia Reeves.D.

## 2018-02-06 NOTE — Progress Notes (Signed)
On call cardiologist ok'ed giving 2200 dose of  Metoprolol and tambocor.

## 2018-02-06 NOTE — Plan of Care (Signed)
  Problem: Education: Goal: Knowledge of General Education information will improve Description Including pain rating scale, medication(s)/side effects and non-pharmacologic comfort measures Outcome: Progressing   Problem: Education: Goal: Knowledge of disease or condition will improve Outcome: Progressing   Problem: Education: Goal: Understanding of medication regimen will improve Outcome: Progressing

## 2018-02-06 NOTE — Progress Notes (Signed)
Patient had more chest discomfort/pain, EKG done, pain meds given. MD paged.

## 2018-02-07 ENCOUNTER — Encounter (HOSPITAL_COMMUNITY)
Admission: EM | Disposition: A | Discharge status: Home / Self Care | Payer: Self-pay | Source: Home / Self Care | Attending: Internal Medicine

## 2018-02-07 DIAGNOSIS — I456 Pre-excitation syndrome: Secondary | ICD-10-CM

## 2018-02-07 HISTORY — PX: SVT ABLATION: EP1225

## 2018-02-07 LAB — POCT ACTIVATED CLOTTING TIME: Activated Clotting Time: 131 seconds

## 2018-02-07 SURGERY — SVT ABLATION

## 2018-02-07 MED ORDER — MIDAZOLAM HCL 5 MG/5ML IJ SOLN
INTRAMUSCULAR | Status: DC | PRN
  Administered 2018-02-07 (×4): 1 mg via INTRAVENOUS
  Administered 2018-02-07: 2 mg via INTRAVENOUS
  Administered 2018-02-07 (×4): 1 mg via INTRAVENOUS
  Administered 2018-02-07: 2 mg via INTRAVENOUS
  Administered 2018-02-07: 1 mg via INTRAVENOUS
  Administered 2018-02-07: 2 mg via INTRAVENOUS

## 2018-02-07 MED ORDER — SODIUM CHLORIDE 0.9% FLUSH
3.0000 mL | Freq: Two times a day (BID) | INTRAVENOUS | Status: DC
  Administered 2018-02-07: 3 mL via INTRAVENOUS

## 2018-02-07 MED ORDER — MIDAZOLAM HCL 5 MG/5ML IJ SOLN
INTRAMUSCULAR | Status: AC
  Filled 2018-02-07: qty 5

## 2018-02-07 MED ORDER — BUPIVACAINE HCL (PF) 0.25 % IJ SOLN
INTRAMUSCULAR | Status: DC | PRN
  Administered 2018-02-07: 30 mL

## 2018-02-07 MED ORDER — PROTAMINE SULFATE 10 MG/ML IV SOLN
INTRAVENOUS | Status: DC | PRN
  Administered 2018-02-07 (×2): 10 mg via INTRAVENOUS

## 2018-02-07 MED ORDER — FENTANYL CITRATE (PF) 100 MCG/2ML IJ SOLN
INTRAMUSCULAR | Status: AC
  Filled 2018-02-07: qty 2

## 2018-02-07 MED ORDER — FENTANYL CITRATE (PF) 100 MCG/2ML IJ SOLN
INTRAMUSCULAR | Status: DC | PRN
  Administered 2018-02-07 (×5): 12.5 ug via INTRAVENOUS
  Administered 2018-02-07 (×3): 25 ug via INTRAVENOUS
  Administered 2018-02-07: 12.5 ug via INTRAVENOUS
  Administered 2018-02-07: 25 ug via INTRAVENOUS
  Administered 2018-02-07: 12.5 ug via INTRAVENOUS

## 2018-02-07 MED ORDER — LIDOCAINE HCL (PF) 1 % IJ SOLN: INTRAMUSCULAR | Status: DC | PRN

## 2018-02-07 MED ORDER — BUPIVACAINE HCL (PF) 0.25 % IJ SOLN
INTRAMUSCULAR | Status: AC
  Filled 2018-02-07: qty 60

## 2018-02-07 MED ORDER — HEPARIN (PORCINE) IN NACL 1000-0.9 UT/500ML-% IV SOLN
INTRAVENOUS | Status: DC | PRN
  Administered 2018-02-07 (×2): 500 mL

## 2018-02-07 MED ORDER — SODIUM CHLORIDE 0.9% FLUSH: 3.0000 mL | INTRAVENOUS | Status: DC | PRN

## 2018-02-07 MED ORDER — SODIUM CHLORIDE 0.9 % IV SOLN
INTRAVENOUS | Status: DC
  Administered 2018-02-07: 12:00:00 via INTRAVENOUS

## 2018-02-07 MED ORDER — HEPARIN SODIUM (PORCINE) 1000 UNIT/ML IJ SOLN
INTRAMUSCULAR | Status: AC
  Filled 2018-02-07: qty 1

## 2018-02-07 MED ORDER — ONDANSETRON HCL 4 MG/2ML IJ SOLN: 4.0000 mg | Freq: Four times a day (QID) | INTRAMUSCULAR | Status: DC | PRN

## 2018-02-07 MED ORDER — PROTAMINE SULFATE 10 MG/ML IV SOLN
INTRAVENOUS | Status: AC
  Filled 2018-02-07: qty 5

## 2018-02-07 MED ORDER — SODIUM CHLORIDE 0.9 % IV SOLN: 250.0000 mL | INTRAVENOUS | Status: DC | PRN

## 2018-02-07 MED ORDER — ACETAMINOPHEN 325 MG PO TABS
650.0000 mg | ORAL_TABLET | ORAL | Status: DC | PRN
  Administered 2018-02-08 (×2): 650 mg via ORAL
  Filled 2018-02-07 (×2): qty 2

## 2018-02-07 MED ORDER — HEPARIN SODIUM (PORCINE) 1000 UNIT/ML IJ SOLN
INTRAMUSCULAR | Status: DC | PRN
  Administered 2018-02-07: 5000 [IU] via INTRAVENOUS
  Administered 2018-02-07: 2000 [IU] via INTRAVENOUS
  Administered 2018-02-07: 3000 [IU] via INTRAVENOUS

## 2018-02-07 SURGICAL SUPPLY — 12 items
CATH CELSIUS THERMO F CV 7FR (ABLATOR) ×3 IMPLANT
CATH EZ STEER NAV 4MM F-J CUR (ABLATOR) ×3 IMPLANT
CATH HEX JOS 2-5-2 65CM 6F REP (CATHETERS) ×3 IMPLANT
CATH JOSEPH QUAD ALLRED 6F REP (CATHETERS) ×6 IMPLANT
INTRODUCER SWARTZ SL2 8F (SHEATH) ×3 IMPLANT
PACK EP LATEX FREE (CUSTOM PROCEDURE TRAY) ×2
PACK EP LF (CUSTOM PROCEDURE TRAY) ×1 IMPLANT
PAD PRO RADIOLUCENT 2001M-C (PAD) ×3 IMPLANT
PATCH CARTO3 (PAD) ×3 IMPLANT
SHEATH PINNACLE 6F 10CM (SHEATH) ×3 IMPLANT
SHEATH PINNACLE 7F 10CM (SHEATH) ×3 IMPLANT
SHEATH PINNACLE 8F 10CM (SHEATH) ×9 IMPLANT

## 2018-02-07 NOTE — Progress Notes (Addendum)
Electrophysiology Rounding Note  Patient Name: Casey Brown Date of Encounter: 02/07/2018  Primary Cardiologist: Wyline MoodBranch Electrophysiologist: Ladona Ridgelaylor   Subjective   The patient is doing well today.  Still with some chest discomfort from DCCV  Inpatient Medications    Scheduled Meds: . etomidate  20 mg Intravenous Once  . fentaNYL (SUBLIMAZE) injection  25 mcg Intravenous Once  . flecainide  100 mg Oral Q12H  . metoprolol tartrate  50 mg Oral BID   Continuous Infusions: . sodium chloride     PRN Meds: acetaminophen, fentaNYL (SUBLIMAZE) injection   Vital Signs    Vitals:   02/06/18 1938 02/07/18 0246 02/07/18 0439 02/07/18 0825  BP: 122/63  102/67 120/79  Pulse: (!) 46  (!) 51 (!) 53  Resp: 18  18   Temp: 98 F (36.7 C)  97.9 F (36.6 C)   TempSrc:   Oral   SpO2: 100%  97%   Weight:  104.3 kg    Height:        Intake/Output Summary (Last 24 hours) at 02/07/2018 0833 Last data filed at 02/07/2018 [id number] Gross per 24 hour  Intake 820 ml  Output 675 ml  Net 145 ml   Filed Weights   02/05/18 0551 02/06/18 0516 02/07/18 0246  Weight: 102.7 kg 104 kg 104.3 kg    Physical Exam    GEN- The patient is well appearing, alert and oriented x 3 today.   Head- normocephalic, atraumatic Eyes-  Sclera clear, conjunctiva pink Ears- hearing intact Oropharynx- clear Neck- supple Lungs- Clear to ausculation bilaterally, normal work of breathing Heart- Regular rate and rhythm  GI- soft, NT, ND, + BS Extremities- no clubbing, cyanosis, or edema Skin- no rash or lesion, multiple tattoos Psych- euthymic mood, full affect Neuro- strength and sensation are intact  Labs    CBC Recent Labs    02/04/18 0957 02/04/18 1014  WBC  --  6.3  NEUTROABS  --  2.7  HGB 15.6 15.3  HCT 46.0 46.1  MCV  --  89.9  PLT  --  317   Basic Metabolic Panel Recent Labs    96/05/5410/16/19 0957 02/04/18 1014  NA 141 139  K 4.1 3.9  CL 107 108  CO2  --  25  GLUCOSE 87 88  BUN 12 13    CREATININE 1.20 1.15  CALCIUM  --  9.3  MG  --  2.2   Liver Function Tests Recent Labs    02/04/18 1014  AST 26  ALT 21  ALKPHOS 63  BILITOT 1.2  PROT 7.6  ALBUMIN 4.1   Cardiac Enzymes Recent Labs    02/04/18 1014  TROPONINI <0.03   Thyroid Function Tests Recent Labs    02/04/18 1106  TSH 1.999    Telemetry    Sinus rhythm (personally reviewed)  Radiology    No results found.    Assessment & Plan    1.  WPW/AF with RVR Delta wave still present on Flecainide and Metoprolol Likely anteroseptal pathway Will discuss with Dr Ladona Ridgelaylor continuing Flecainide vs RFCA NPO after breakfast (pt aware)  2.  Substance abuse Cocaine cessation advised   For questions or updates, please contact [facility] Please consult [email or web address] for contact info under Cardiology/STEMI.  Signed, Gypsy BalsamAmber Seiler, NP  02/07/2018, 8:33 AM   EP Attending  Patient seen and examined. Agree with above. The patient's AP persists. I have discussed the indications/risks/benefits/goals/expectations of EPS/RFA of WPW syndrome and he wishes to proceed.  Leonia ReevesGregg Donasia Wimes,M.D.

## 2018-02-07 NOTE — Progress Notes (Signed)
Site area:  Rt fa sheath and 3 rt fv sheaths Site Prior to Removal:  Level 0 Pressure Applied For: 25 minutes Manual:   yes Patient Status During Pull:  stable Post Pull Site:  Level 0 Post Pull Instructions Given:  yes Post Pull Pulses Present:  Rt pt palpable Dressing Applied:  Gauze and tegaderm Bedrest begins @ 1925 Comments: IV saline locked

## 2018-02-07 NOTE — Interval H&P Note (Signed)
History and Physical Interval Note:  02/07/2018 2:17 PM  Casey Brown  has presented today for surgery, with the diagnosis of svt  The various methods of treatment have been discussed with the patient and family. After consideration of risks, benefits and other options for treatment, the patient has consented to  Procedure(s): SVT ABLATION (N/A) as a surgical intervention .  The patient's history has been reviewed, patient examined, no change in status, stable for surgery.  I have reviewed the patient's chart and labs.  Questions were answered to the patient's satisfaction.     Lewayne BuntingGregg Rudie Sermons

## 2018-02-07 NOTE — Progress Notes (Signed)
Site area: rt IJ venous sheath Site Prior to Removal:  Level 0 Pressure Applied For: 10 minutes Manual:   yes Patient Status During Pull:  stable Post Pull Site:  Level 0 Post Pull Instructions Given:  yes Post Pull Pulses Present: NA Dressing Applied:  Gauze and tegaderm Bedrest begins @  Comments:   

## 2018-02-07 NOTE — H&P (View-Only) (Signed)
Electrophysiology Rounding Note  Patient Name: Casey Brown Date of Encounter: 02/07/2018  Primary Cardiologist: Branch Electrophysiologist: Melonee Gerstel   Subjective   The patient is doing well today.  Still with some chest discomfort from DCCV  Inpatient Medications    Scheduled Meds: . etomidate  20 mg Intravenous Once  . fentaNYL (SUBLIMAZE) injection  25 mcg Intravenous Once  . flecainide  100 mg Oral Q12H  . metoprolol tartrate  50 mg Oral BID   Continuous Infusions: . sodium chloride     PRN Meds: acetaminophen, fentaNYL (SUBLIMAZE) injection   Vital Signs    Vitals:   02/06/18 1938 02/07/18 0246 02/07/18 0439 02/07/18 0825  BP: 122/63  102/67 120/79  Pulse: (!) 46  (!) 51 (!) 53  Resp: 18  18   Temp: 98 F (36.7 C)  97.9 F (36.6 C)   TempSrc:   Oral   SpO2: 100%  97%   Weight:  104.3 kg    Height:        Intake/Output Summary (Last 24 hours) at 02/07/2018 0833 Last data filed at 02/07/2018 0822 Gross per 24 hour  Intake 820 ml  Output 675 ml  Net 145 ml   Filed Weights   02/05/18 0551 02/06/18 0516 02/07/18 0246  Weight: 102.7 kg 104 kg 104.3 kg    Physical Exam    GEN- The patient is well appearing, alert and oriented x 3 today.   Head- normocephalic, atraumatic Eyes-  Sclera clear, conjunctiva pink Ears- hearing intact Oropharynx- clear Neck- supple Lungs- Clear to ausculation bilaterally, normal work of breathing Heart- Regular rate and rhythm  GI- soft, NT, ND, + BS Extremities- no clubbing, cyanosis, or edema Skin- no rash or lesion, multiple tattoos Psych- euthymic mood, full affect Neuro- strength and sensation are intact  Labs    CBC Recent Labs    02/04/18 0957 02/04/18 1014  WBC  --  6.3  NEUTROABS  --  2.7  HGB 15.6 15.3  HCT 46.0 46.1  MCV  --  89.9  PLT  --  317   Basic Metabolic Panel Recent Labs    02/04/18 0957 02/04/18 1014  NA 141 139  K 4.1 3.9  CL 107 108  CO2  --  25  GLUCOSE 87 88  BUN 12 13    CREATININE 1.20 1.15  CALCIUM  --  9.3  MG  --  2.2   Liver Function Tests Recent Labs    02/04/18 1014  AST 26  ALT 21  ALKPHOS 63  BILITOT 1.2  PROT 7.6  ALBUMIN 4.1   Cardiac Enzymes Recent Labs    02/04/18 1014  TROPONINI <0.03   Thyroid Function Tests Recent Labs    02/04/18 1106  TSH 1.999    Telemetry    Sinus rhythm (personally reviewed)  Radiology    No results found.    Assessment & Plan    1.  WPW/AF with RVR Delta wave still present on Flecainide and Metoprolol Likely anteroseptal pathway Will discuss with Dr Keeyon Privitera continuing Flecainide vs RFCA NPO after breakfast (pt aware)  2.  Substance abuse Cocaine cessation advised   For questions or updates, please contact [facility] Please consult [email or web address] for contact info under Cardiology/STEMI.  Signed, Amber Seiler, NP  02/07/2018, 8:33 AM   EP Attending  Patient seen and examined. Agree with above. The patient's AP persists. I have discussed the indications/risks/benefits/goals/expectations of EPS/RFA of WPW syndrome and he wishes to proceed.  Lavalle Skoda,M.D.  

## 2018-02-08 ENCOUNTER — Encounter: Payer: Self-pay | Admitting: Nurse Practitioner

## 2018-02-08 ENCOUNTER — Encounter (HOSPITAL_COMMUNITY): Payer: Self-pay | Admitting: Internal Medicine

## 2018-02-08 NOTE — Discharge Summary (Addendum)
ELECTROPHYSIOLOGY PROCEDURE DISCHARGE SUMMARY    Patient ID: Casey Brown,  MRN: [id number], DOB/AGE: [birth year] 46 y.o.  Admit date: 02/04/2018 Discharge date: 02/08/2018  Primary Care Physician: Patient, No Pcp Per Primary Cardiologist: Branch Electrophysiologist: Ladona Ridgelaylor  Primary Discharge Diagnosis:  1.  WPW s/p ablation this admission 2.  Atrial fibrillation with RVR  No Known Allergies  Procedures This Admission:  1.  Electrophysiology study and radiofrequency catheter ablation on 02/07/18 by Dr Ladona Ridgelaylor. This study demonstrated successful EP study and catheter ablation of a manifest accessory pathway in a patient with symptomatic atrial fibrillation with manifest ventricular preexcitation.  Following catheter ablation there is no ventricular preexcitation. There were no early apparent complications.  Brief HPI/Hospital Course:  Casey Aluorris Varghese is a 47 y.o. male with prior cocaine abuse. About 2 years ago, he had an episode of tachycardia and was told at that point he had WPW. He has not been seen by cardiology since.  He presented to the ER at [facility] for evaluation of ongoing tachypalpitations.  His episode started 02/03/18 around 8PM and persisted.  It was associated with shortness of breath and intermittent chest pain.  Because of persistent symptoms, he presented to the ER. There he was found to be in AF with RVR with variable degrees of pre-excitation and was cardioverted by ER staff.  Dr Wyline MoodBranch discussed with Dr Ladona Ridgelaylor and [facility].  He was initially started on Flecainide 100mg  twice daily which failed to suppress accessory pathway.  Therefore ablation was recommended.  He underwent ablation by Dr Ladona Ridgelaylor on 02/07/18 with details as outlined in the op note.  There were no early apparent complications.  Post procedure, his groin was without complication.  Telemetry demonstrated SR.  He was seen by Dr Ladona Ridgelaylor and considered stable for discharge  to home.   Physical Exam: Vitals:   02/08/18 0031 02/08/18 0047 02/08/18 0333 02/08/18 0636  BP: 118/70 109/66  111/73  Pulse: 62 66  71  Resp:    18  Temp:    98 F (36.7 C)  TempSrc:      SpO2: 97% 97%  98%  Weight:   103.2 kg   Height:        GEN- The patient is well appearing, alert and oriented x 3 today.   HEENT: normocephalic, atraumatic; sclera clear, conjunctiva pink; hearing intact; oropharynx clear; neck supple  Lungs- Clear to ausculation bilaterally, normal work of breathing.  No wheezes, rales, rhonchi Heart- Regular rate and rhythm  GI- soft, non-tender, non-distended, bowel sounds present  Extremities- no clubbing, cyanosis, or edema  MS- no significant deformity or atrophy Skin- warm and dry, no rash or lesion Psych- euthymic mood, full affect Neuro- strength and sensation are intact    Labs:   Lab Results  Component Value Date   WBC 6.3 02/04/2018   HGB 15.3 02/04/2018   HCT 46.1 02/04/2018   MCV 89.9 02/04/2018   PLT 317 02/04/2018    Recent Labs  Lab 02/04/18 1014  NA 139  K 3.9  CL 108  CO2 25  BUN 13  CREATININE 1.15  CALCIUM 9.3  PROT 7.6  BILITOT 1.2  ALKPHOS 63  ALT 21  AST 26  GLUCOSE 88     Discharge Medications:  Allergies as of 02/08/2018   No Known Allergies     Medication List    You have not been prescribed any medications.     Disposition: Pt is  being discharged home today in good condition. Discharge Instructions    Diet - low sodium heart healthy   Complete by:  As directed    Increase activity slowly   Complete by:  As directed      Follow-up Information    Marinus Mawaylor, Gregg W, MD Follow up on 03/18/2018.   Specialty:  Cardiology Why:  at 10:30AM Contact information: [address] [place] [id number] [phone]           Duration of Discharge Encounter: Greater than 30 minutes including physician time.  Signed, Gypsy BalsamAmber Seiler, NP 02/08/2018 7:13 AM  EP attending  Patient seen and  examined.  Agree with the findings as noted above.  Post procedure chest x-ray demonstrates no ventricular preexcitation.  He will be discharged home with no antiarrhythmic drug therapy or beta-blocker.  Instructions been given about return of normal activity.  I will see him back in the office in several weeks.  Lewayne BuntingGregg Taylor, MD

## 2018-02-08 NOTE — Progress Notes (Signed)
Patient ready for discharge to home. Family here to provided transportation All personal belongings with patient.  Discharge instructions reviewed with patient, medications and follow up appts reviewed.  Patient demonstrates no s/sx of distress at this time.

## 2018-02-08 NOTE — Discharge Instructions (Signed)
No driving for 4 days. No lifting over 5 lbs for 1 week. No sexual activity for 1 week. You may return to work in 1 week. Keep procedure site clean & dry. If you notice increased pain, swelling, bleeding or pus, call/return!  You may shower, but no soaking baths/hot tubs/pools for 1 week. May return to work without restrictions in 1 week.

## 2018-02-11 LAB — POCT ACTIVATED CLOTTING TIME
Activated Clotting Time: 169 seconds
Activated Clotting Time: 191 seconds
Activated Clotting Time: 197 seconds

## 2018-02-17 ENCOUNTER — Encounter (HOSPITAL_COMMUNITY): Payer: Self-pay | Admitting: Emergency Medicine

## 2018-02-17 ENCOUNTER — Emergency Department (HOSPITAL_COMMUNITY): Payer: Managed Care, Other (non HMO)

## 2018-02-17 ENCOUNTER — Other Ambulatory Visit: Payer: Self-pay

## 2018-02-17 ENCOUNTER — Emergency Department (HOSPITAL_COMMUNITY)
Admission: EM | Admit: 2018-02-17 | Discharge: 2018-02-17 | Disposition: A | Discharge status: Home / Self Care | Payer: Managed Care, Other (non HMO) | Attending: Emergency Medicine | Admitting: Emergency Medicine

## 2018-02-17 DIAGNOSIS — R002 Palpitations: Secondary | ICD-10-CM | POA: Diagnosis not present

## 2018-02-17 DIAGNOSIS — F17210 Nicotine dependence, cigarettes, uncomplicated: Secondary | ICD-10-CM | POA: Diagnosis not present

## 2018-02-17 DIAGNOSIS — R000 Tachycardia, unspecified: Secondary | ICD-10-CM | POA: Diagnosis present

## 2018-02-17 LAB — BASIC METABOLIC PANEL
Anion gap: 9 (ref 5–15)
BUN: 13 mg/dL (ref 6–20)
CHLORIDE: 104 mmol/L (ref 98–111)
CO2: 26 mmol/L (ref 22–32)
Calcium: 9.1 mg/dL (ref 8.9–10.3)
Creatinine, Ser: 0.94 mg/dL (ref 0.61–1.24)
GFR calc Af Amer: >60 mL/min (ref >60)
GFR calc non Af Amer: >60 mL/min (ref >60)
Glucose, Bld: 97 mg/dL (ref 70–99)
Potassium: 4.1 mmol/L (ref 3.5–5.1)
SODIUM: 139 mmol/L (ref 135–145)

## 2018-02-17 LAB — CBC
HCT: 42.5 % (ref 39.0–52.0)
HEMOGLOBIN: 14.9 g/dL (ref 13.0–17.0)
MCH: 32.1 pg (ref 26.0–34.0)
MCHC: 35.1 g/dL (ref 30.0–36.0)
MCV: 91.6 fL (ref 80.0–100.0)
Platelets: 358 10*3/uL (ref 150–400)
RBC: 4.64 MIL/uL (ref 4.22–5.81)
RDW: 13.1 % (ref 11.5–15.5)
WBC: 5.7 10*3/uL (ref 4.0–10.5)
nRBC: 0 % (ref 0.0–0.2)

## 2018-02-17 LAB — I-STAT TROPONIN, ED: Troponin i, poc: 0.01 ng/mL (ref 0.00–0.08)

## 2018-02-17 MED ORDER — METOPROLOL TARTRATE 25 MG PO TABS: 25.0000 mg | ORAL_TABLET | Freq: Once | ORAL | 0 refills | Status: DC | PRN

## 2018-02-17 NOTE — ED Triage Notes (Signed)
C/o heart racing since 4pm with mild SOB and L sided chest pain. SVT ablation on12/19.

## 2018-02-17 NOTE — Discharge Instructions (Signed)
You can take metoprolol if you have palpitations again.  If your palpitations last thirty minutes or more go the the Emergency Department for recheck.  Get rechecked if you develop any new or concerning symptoms.

## 2018-02-17 NOTE — ED Provider Notes (Signed)
[facility] Provider Note   CSN: [id number] Arrival date & time: 02/17/18  1724     History   Chief Complaint Chief Complaint  Patient presents with  . Tachycardia    HPI Casey Brown is a 47 y.o. male.  The history is provided by the patient. No language interpreter was used.   Casey Brown is a 47 y.o. male who presents to the Emergency Department complaining of palpitations.  He has a hx/o SVT s/p ablation on 12/20 and presents to the ED for evaluation of palpitations that began at 1600 today.   Sxs lasted for about 20 minutes and are described as a pounding in his chest.  Sxs are currently resolved.  Sxs occurred during activity (while working).  Denies CP, SOB, n/v.  He did briefly have diarrhea and subjective fever following hospital d/c - now resolved.  No additional sxs.  He takes no medications.   Past Medical History:  Diagnosis Date  . Arthritis    "ankles" (02/04/2018)  . Chronic bronchitis (HCC)   . GERD (gastroesophageal reflux disease)   . Heart murmur   . WPW (Wolff-Parkinson-White syndrome)     Patient Active Problem List   Diagnosis Date Noted  . Wolff-Parkinson-White (WPW) syndrome 02/05/2018    Past Surgical History:  Procedure Laterality Date  . MOLE REMOVAL Left ~ 1980   "back"  . SVT ABLATION N/A 02/07/2018   Procedure: SVT ABLATION;  Surgeon: Marinus Mawaylor, Gregg W, MD;  [facility];  Service: Cardiovascular;  Laterality: N/A;        Home Medications    Prior to Admission medications   Medication Sig Start Date End Date Taking? Authorizing Provider  metoprolol tartrate (LOPRESSOR) 25 MG tablet Take 1 tablet (25 mg total) by mouth once as needed for up to 1 dose. 02/17/18   Tilden Fossaees, Avalin Briley, MD    Family History No family history on file.  Social History Social History   Tobacco Use  . Smoking status: Current Every Day Smoker    Packs/day: 0.50    Years: 33.00    Pack years: 16.50      Types: Cigarettes    Start date: 01/20/1993  . Smokeless tobacco: Never Used  Substance Use Topics  . Alcohol use: Yes    Alcohol/week: 12.0 standard drinks    Types: 12 Cans of beer per week    Comment: 02/04/2018 "plus liquor on special occasions"  . Drug use: Yes    Types: Cocaine, Marijuana    Comment: 02/04/2018 "cocaine 2 wks ago; marijuana on special occasions; holidays; birthdays"     Allergies   Patient has no known allergies.   Review of Systems Review of Systems  All other systems reviewed and are negative.    Physical Exam Updated Vital Signs BP 120/73 (BP Location: Right Arm)   Pulse 71   Temp 99.1 F (37.3 C) (Oral)   Resp 15   Ht 6\' 2"  (1.88 m)   Wt 103.2 kg   SpO2 97%   BMI 29.21 kg/m   Physical Exam Vitals signs and nursing note reviewed.  Constitutional:      Appearance: He is well-developed.  HENT:     Head: Normocephalic and atraumatic.  Cardiovascular:     Rate and Rhythm: Normal rate and regular rhythm.     Heart sounds: No murmur.  Pulmonary:     Effort: Pulmonary effort is normal. No respiratory distress.     Breath sounds:  Normal breath sounds.  Abdominal:     Palpations: Abdomen is soft.     Tenderness: There is no abdominal tenderness. There is no guarding or rebound.  Musculoskeletal:        General: No swelling or tenderness.  Skin:    General: Skin is warm and dry.  Neurological:     General: No focal deficit present.     Mental Status: He is alert and oriented to person, place, and time.  Psychiatric:        Mood and Affect: Mood normal.        Behavior: Behavior normal.      ED Treatments / Results  Labs (all labs ordered are listed, but only abnormal results are displayed) Labs Reviewed  BASIC METABOLIC PANEL  CBC  I-STAT TROPONIN, ED    EKG None ED ECG REPORT   Date: 02/17/2018  Rate: 87  Rhythm: normal sinus rhythm  QRS Axis: normal  Intervals: normal  ST/T Wave abnormalities: nonspecific ST  changes  Conduction Disutrbances:none  Narrative Interpretation:   Old EKG Reviewed: unchanged  I have personally reviewed the EKG tracing and agree with the computerized printout as noted.  Radiology Dg Chest 2 View  Result Date: 02/17/2018 CLINICAL DATA:  Chest pain. EXAM: CHEST - 2 VIEW COMPARISON:  Radiograph February 04, 2018. FINDINGS: The heart size and mediastinal contours are within normal limits. Both lungs are clear. No pneumothorax or pleural effusion is noted. The visualized skeletal structures are unremarkable. IMPRESSION: No active cardiopulmonary disease. Electronically Signed   By: Lupita RaiderJames  Green Jr, M.D.   On: 02/17/2018 19:11    Procedures Procedures (including critical care time)  Medications Ordered in ED Medications - No data to display   Initial Impression / Assessment and Plan / ED Course  I have reviewed the triage vital signs and the nursing notes.  Pertinent labs & imaging results that were available during my care of the patient were reviewed by me and considered in my medical decision making (see chart for details).     Patient with history of WP W here for evaluation after episode of palpitations lasting 20 minutes. He is asymptomatic on ED arrival. EKG is similar when compared to priors. Discussed with on-call cardiologist, plan to provide metoprolol PRN for recurrent palpitations with close outpatient follow-up and return precautions.  Presentation is not consistent with ACS, PE, dissection.  Final Clinical Impressions(s) / ED Diagnoses   Final diagnoses:  None    ED Discharge Orders         Ordered    metoprolol tartrate (LOPRESSOR) 25 MG tablet  Once PRN     02/17/18 2040           Tilden Fossaees, Aliegha Paullin, MD 02/17/18 2042

## 2018-02-17 NOTE — ED Notes (Addendum)
Dr Madilyn Hookees at bedside. Pt states that he was working PTA and had 20 minute episode of heart racing. Pt had ablation last week for SVT. Pt had been feeling better since then.

## 2018-02-17 NOTE — ED Notes (Signed)
Pt back from x-ray.

## 2018-03-18 ENCOUNTER — Encounter: Payer: Self-pay | Admitting: Internal Medicine

## 2018-03-18 ENCOUNTER — Ambulatory Visit (INDEPENDENT_AMBULATORY_CARE_PROVIDER_SITE_OTHER): Payer: Managed Care, Other (non HMO) | Admitting: Internal Medicine

## 2018-03-18 VITALS — BP 98/52 | HR 73 | Ht 74.0 in | Wt 231.0 lb

## 2018-03-18 DIAGNOSIS — R079 Chest pain, unspecified: Secondary | ICD-10-CM

## 2018-03-18 MED ORDER — NITROGLYCERIN 0.4 MG SL SUBL: 0.4000 mg | SUBLINGUAL_TABLET | SUBLINGUAL | 2 refills | Status: AC | PRN

## 2018-03-18 MED ORDER — METOPROLOL TARTRATE 25 MG PO TABS: 25.0000 mg | ORAL_TABLET | Freq: Once | ORAL | 6 refills | Status: DC | PRN

## 2018-03-18 NOTE — Addendum Note (Signed)
Addended by: Kerney Elbe on: 03/18/2018 11:13 AM   Modules accepted: Orders

## 2018-03-18 NOTE — Progress Notes (Signed)
HPI Mr. Bonawitz returns today for followup. He is a pleasant 48 yo man with a h/o pre-excited WPW and atrial fib with a RVR. 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 Catheter ablation successfully eliminated AP conduction and he was non-inducible. He has had chest pain and one episode of heart racing in the interim. He has minimal palpitations. He is still taking his metoprolol.  No Known Allergies   Current Outpatient Medications  Medication Sig Dispense Refill  . metoprolol tartrate (LOPRESSOR) 25 MG tablet Take 1 tablet (25 mg total) by mouth once as needed for up to 1 dose. 15 tablet 0   No current facility-administered medications for this visit.      Past Medical History:  Diagnosis Date  . Arthritis    "ankles" (02/04/2018)  . Chronic bronchitis (HCC)   . GERD (gastroesophageal reflux disease)   . Heart murmur   . WPW (Wolff-Parkinson-White syndrome)     ROS:   All systems reviewed and negative except as noted in the HPI.   Past Surgical History:  Procedure Laterality Date  . MOLE REMOVAL Left ~ 1980   "back"  . SVT ABLATION N/A 02/07/2018   Procedure: SVT ABLATION;  Surgeon: Marinus Maw, MD;  Location: [facility] INVASIVE CV LAB;  Service: Cardiovascular;  Laterality: N/A;     History reviewed. No pertinent family history.   Social History   Socioeconomic History  . Marital status: Divorced    Spouse name: Not on file  . Number of children: Not on file  . Years of education: Not on file  . Highest education level: Not on file  Occupational History  . Not on file  Social Needs  . Financial resource strain: Not on file  . Food insecurity:    Worry: Not on file    Inability: Not on file  . Transportation needs:    Medical: Not on file    Non-medical: Not on file  Tobacco Use  . Smoking status: Current Every Day Smoker    Packs/day: 0.50    Years: 33.00    Pack years: 16.50      Types: Cigarettes    Start date: 01/20/1993  . Smokeless tobacco: Never Used  Substance and Sexual Activity  . Alcohol use: Yes    Alcohol/week: 12.0 standard drinks    Types: 12 Cans of beer per week    Comment: 02/04/2018 "plus liquor on special occasions"  . Drug use: Yes    Types: Cocaine, Marijuana    Comment: 02/04/2018 "cocaine 2 wks ago; marijuana on special occasions; holidays; birthdays"  . Sexual activity: Not Currently  Lifestyle  . Physical activity:    Days per week: Not on file    Minutes per session: Not on file  . Stress: Not on file  Relationships  . Social connections:    Talks on phone: Not on file    Gets together: Not on file    Attends religious service: Not on file    Active member of club or organization: Not on file    Attends meetings of clubs or organizations: Not on file    Relationship status: Not on file  . Intimate partner violence:    Fear of current or ex partner: Not on file    Emotionally abused: Not on file    Physically abused: Not on file    Forced sexual activity:  Not on file  Other Topics Concern  . Not on file  Social History Narrative  . Not on file     BP (!) 98/52   Pulse 73   Ht 6\' 2"  (1.88 m)   Wt 231 lb (104.8 kg)   SpO2 98%   BMI 29.66 kg/m   Physical Exam:  Well appearing 48 yo man, NAD HEENT: Unremarkable Neck:  6 cm JVD, no thyromegally Lymphatics:  No adenopathy Back:  No CVA tenderness Lungs:  Clear with no wheezes HEART:  Regular rate rhythm, no murmurs, no rubs, no clicks Abd:  soft, positive bowel sounds, no organomegally, no rebound, no guarding Ext:  2 plus pulses, no edema, no cyanosis, no clubbing Skin:  No rashes no nodules Neuro:  CN II through XII intact, motor grossly intact  EKG - reviewed from 02/21/18. No pre-excitation   Assess/Plan: 1. WpW - he is s/p ablation. He has had some recurrent symptoms although his most recent ECG was unrevealing. I have recommended he undergo a 14 day zio  patch.  2. Chest pain - I doubt he is having angina. We will consider additional eval based on his symptoms when he returns for followup depending on how his zio patch looks. For now, he is prescribed slntg. 3. PAF - he may be having some but no documented arrhythmias since his ablation. We will follow. No indication for anti-coagulation at this point.  Leonia ReevesGregg Taylor,M.D.

## 2018-03-18 NOTE — Patient Instructions (Addendum)
Medication Instructions:   ADDENDUM 03/19/2018--Take Toprol XL 25 mg one tablet by mouth daily   Boneta Lucks RN  Your physician recommends that you continue on your current medications as directed. Please refer to the Current Medication list given to you today.  If you need a refill on your cardiac medications before your next appointment, please call your pharmacy.   Lab work: NONE  If you have labs (blood work) drawn today and your tests are completely normal, you will receive your results only by: Marland Kitchen MyChart Message (if you have MyChart) OR . A paper copy in the mail If you have any lab test that is abnormal or we need to change your treatment, we will call you to review the results.  Testing/Procedures: Your physician has recommended that you wear an event monitor. Event monitors are medical devices that record the heart's electrical activity. Doctors most often [place] these monitors to diagnose arrhythmias. Arrhythmias are problems with the speed or rhythm of the heartbeat. The monitor is a small, portable device. You can wear one while you do your normal daily activities. This is usually used to diagnose what is causing palpitations/syncope (passing out).    Follow-Up: At [facility], you and your health needs are our priority.  As part of our continuing mission to provide you with exceptional heart care, we have created designated Provider Care Teams.  These Care Teams include your primary Cardiologist (physician) and Advanced Practice Providers (APPs -  Physician Assistants and Nurse Practitioners) who all work together to provide you with the care you need, when you need it. You will need a follow up appointment in 1 months.  Please call our office 2 months in advance to schedule this appointment.  You may see None or one of the following Advanced Practice Providers on your designated Care Team:   Gypsy Balsam, NP . Francis Dowse, PA-C  Any Other Special Instructions Will Be Listed Below  (If Applicable). Thank you for choosing [facility]!

## 2018-03-19 ENCOUNTER — Telehealth: Payer: Self-pay

## 2018-03-19 MED ORDER — METOPROLOL SUCCINATE ER 25 MG PO TB24: 25.0000 mg | ORAL_TABLET | Freq: Every day | ORAL | 3 refills | Status: AC

## 2018-03-19 NOTE — Addendum Note (Signed)
Addended by: Roney Mans A on: 03/19/2018 08:11 AM   Modules accepted: Orders

## 2018-03-19 NOTE — Telephone Encounter (Signed)
Per Dr. Taylor-corrected metoprolol order to succinate--25 mg one tablet by mouth daily.  Resent to [facility].  Pt notified.

## 2018-04-02 ENCOUNTER — Ambulatory Visit (INDEPENDENT_AMBULATORY_CARE_PROVIDER_SITE_OTHER): Payer: Managed Care, Other (non HMO)

## 2018-04-02 DIAGNOSIS — R079 Chest pain, unspecified: Secondary | ICD-10-CM | POA: Diagnosis not present

## 2018-04-09 ENCOUNTER — Ambulatory Visit (INDEPENDENT_AMBULATORY_CARE_PROVIDER_SITE_OTHER): Payer: Managed Care, Other (non HMO) | Admitting: Internal Medicine

## 2018-04-09 ENCOUNTER — Encounter: Payer: Self-pay | Admitting: Internal Medicine

## 2018-04-09 VITALS — BP 116/80 | HR 86 | Ht 74.0 in | Wt 234.0 lb

## 2018-04-09 DIAGNOSIS — I456 Pre-excitation syndrome: Secondary | ICD-10-CM | POA: Diagnosis not present

## 2018-04-09 NOTE — Patient Instructions (Signed)
Medication Instructions:  Your physician recommends that you continue on your current medications as directed. Please refer to the Current Medication list given to you today.  If you need a refill on your cardiac medications before your next appointment, please call your pharmacy.   Lab work: NONE  If you have labs (blood work) drawn today and your tests are completely normal, you will receive your results only by: Marland Kitchen MyChart Message (if you have MyChart) OR . A paper copy in the mail If you have any lab test that is abnormal or we need to change your treatment, we will call you to review the results.  Testing/Procedures: Your physician has requested that you have an exercise tolerance test. For further information please visit [email or web address] Please also follow instruction sheet, as given.    Follow-Up: At [facility], you and your health needs are our priority.  As part of our continuing mission to provide you with exceptional heart care, we have created designated Provider Care Teams.  These Care Teams include your primary Cardiologist (physician) and Advanced Practice Providers (APPs -  Physician Assistants and Nurse Practitioners) who all work together to provide you with the care you need, when you need it. You will need a follow up appointment in Pending test results .  Please call our office 2 months in advance to schedule this appointment.  You may see Lewayne Bunting, MD or one of the following Advanced Practice Providers on your designated Care Team:   Gypsy Balsam, NP . Francis Dowse, PA-C  Any Other Special Instructions Will Be Listed Below (If Applicable). Thank you for choosing [facility]!

## 2018-04-09 NOTE — H&P (View-Only) (Signed)
    HPI Mr. Lapidus returns today for followup. He is a pleasant 48 yo man with WPW syndrome and SVT s/p a very difficult ablation 2 months ago. He was found to have an AP at the Aorto-mitral continuity and was successfully ablated. The patient had no AP conduction initially but subsequently has had recurrent AP conduction though no evidence of recurrent SVT. He does have fleeting episodes of non-cardiac chest pain. He wore a cardiac monitor which appeared to demonstrate pre-excitation at slow heart rates but not at faster heart rates. He did not have any SVT. No palpitations. No Known Allergies   Current Outpatient Medications  Medication Sig Dispense Refill  . metoprolol succinate (TOPROL XL) 25 MG 24 hr tablet Take 1 tablet (25 mg total) by mouth daily. 90 tablet 3  . nitroGLYCERIN (NITROSTAT) 0.4 MG SL tablet Place 1 tablet (0.4 mg total) under the tongue every 5 (five) minutes as needed for chest pain. 30 tablet 2   No current facility-administered medications for this visit.      Past Medical History:  Diagnosis Date  . Arthritis    "ankles" (02/04/2018)  . Chronic bronchitis (HCC)   . GERD (gastroesophageal reflux disease)   . Heart murmur   . WPW (Wolff-Parkinson-White syndrome)     ROS:   All systems reviewed and negative except as noted in the HPI.   Past Surgical History:  Procedure Laterality Date  . MOLE REMOVAL Left ~ 1980   "back"  . SVT ABLATION N/A 02/07/2018   Procedure: SVT ABLATION;  Surgeon: Taylor, Gregg W, MD;  Location: [facility] INVASIVE CV LAB;  Service: Cardiovascular;  Laterality: N/A;     History reviewed. No pertinent family history.   Social History   Socioeconomic History  . Marital status: Divorced    Spouse name: Not on file  . Number of children: Not on file  . Years of education: Not on file  . Highest education level: Not on file  Occupational History  . Not on file  Social Needs  . Financial resource strain: Not on file  . Food  insecurity:    Worry: Not on file    Inability: Not on file  . Transportation needs:    Medical: Not on file    Non-medical: Not on file  Tobacco Use  . Smoking status: Current Every Day Smoker    Packs/day: 0.50    Years: 33.00    Pack years: 16.50    Types: Cigarettes    Start date: 01/20/1993  . Smokeless tobacco: Never Used  Substance and Sexual Activity  . Alcohol use: Yes    Alcohol/week: 12.0 standard drinks    Types: 12 Cans of beer per week    Comment: 02/04/2018 "plus liquor on special occasions"  . Drug use: Yes    Types: Cocaine, Marijuana    Comment: 02/04/2018 "cocaine 2 wks ago; marijuana on special occasions; holidays; birthdays"  . Sexual activity: Not Currently  Lifestyle  . Physical activity:    Days per week: Not on file    Minutes per session: Not on file  . Stress: Not on file  Relationships  . Social connections:    Talks on phone: Not on file    Gets together: Not on file    Attends religious service: Not on file    Active member of club or organization: Not on file    Attends meetings of clubs or organizations: Not on file    Relationship status:   Not on file  . Intimate partner violence:    Fear of current or ex partner: Not on file    Emotionally abused: Not on file    Physically abused: Not on file    Forced sexual activity: Not on file  Other Topics Concern  . Not on file  Social History Narrative  . Not on file     BP 116/80   Pulse 86   Ht 6\' 2"  (1.88 m)   Wt 234 lb (106.1 kg)   SpO2 97%   BMI 30.04 kg/m   Physical Exam:  Well appearing NAD HEENT: Unremarkable Neck:  No JVD, no thyromegally Lymphatics:  No adenopathy Back:  No CVA tenderness Lungs:  Clear with no wheezes HEART:  Regular rate rhythm, no murmurs, no rubs, no clicks Abd:  soft, positive bowel sounds, no organomegally, no rebound, no guarding Ext:  2 plus pulses, no edema, no cyanosis, no clubbing Skin:  No rashes no nodules Neuro:  CN II through XII intact,  motor grossly intact  EKG - nsr with pre-excitation  Assess/Plan: 1. WPW - he is s/p ablation but has not had any recurrent SVT documented. He appears to lose AP conduction at faster heart rates. I suspect his pathway has been modified. I have recommended he undergo exercise treadmill testing to see if he has exercise induced SVT and/or loss of pre-excitation.  2. Chest pain - his symptoms are atypical for CAD but he does have some risk factors. We will assess with GXT. 3. NSVT - he had occaisional brief episodes of NSVT.   Leonia Reeves.D.

## 2018-04-09 NOTE — Progress Notes (Signed)
HPI Casey Brown returns today for followup. He is a pleasant 48 yo man with WPW syndrome and SVT s/p a very difficult ablation 2 months ago. He was found to have an AP at the Aorto-mitral continuity and was successfully ablated. The patient had no AP conduction initially but subsequently has had recurrent AP conduction though no evidence of recurrent SVT. He does have fleeting episodes of non-cardiac chest pain. He wore a cardiac monitor which appeared to demonstrate pre-excitation at slow heart rates but not at faster heart rates. He did not have any SVT. No palpitations. No Known Allergies   Current Outpatient Medications  Medication Sig Dispense Refill  . metoprolol succinate (TOPROL XL) 25 MG 24 hr tablet Take 1 tablet (25 mg total) by mouth daily. 90 tablet 3  . nitroGLYCERIN (NITROSTAT) 0.4 MG SL tablet Place 1 tablet (0.4 mg total) under the tongue every 5 (five) minutes as needed for chest pain. 30 tablet 2   No current facility-administered medications for this visit.      Past Medical History:  Diagnosis Date  . Arthritis    "ankles" (02/04/2018)  . Chronic bronchitis (HCC)   . GERD (gastroesophageal reflux disease)   . Heart murmur   . WPW (Wolff-Parkinson-White syndrome)     ROS:   All systems reviewed and negative except as noted in the HPI.   Past Surgical History:  Procedure Laterality Date  . MOLE REMOVAL Left ~ 1980   "back"  . SVT ABLATION N/A 02/07/2018   Procedure: SVT ABLATION;  Surgeon: Marinus Maw, MD;  Location: [facility] INVASIVE CV LAB;  Service: Cardiovascular;  Laterality: N/A;     History reviewed. No pertinent family history.   Social History   Socioeconomic History  . Marital status: Divorced    Spouse name: Not on file  . Number of children: Not on file  . Years of education: Not on file  . Highest education level: Not on file  Occupational History  . Not on file  Social Needs  . Financial resource strain: Not on file  . Food  insecurity:    Worry: Not on file    Inability: Not on file  . Transportation needs:    Medical: Not on file    Non-medical: Not on file  Tobacco Use  . Smoking status: Current Every Day Smoker    Packs/day: 0.50    Years: 33.00    Pack years: 16.50    Types: Cigarettes    Start date: 01/20/1993  . Smokeless tobacco: Never Used  Substance and Sexual Activity  . Alcohol use: Yes    Alcohol/week: 12.0 standard drinks    Types: 12 Cans of beer per week    Comment: 02/04/2018 "plus liquor on special occasions"  . Drug use: Yes    Types: Cocaine, Marijuana    Comment: 02/04/2018 "cocaine 2 wks ago; marijuana on special occasions; holidays; birthdays"  . Sexual activity: Not Currently  Lifestyle  . Physical activity:    Days per week: Not on file    Minutes per session: Not on file  . Stress: Not on file  Relationships  . Social connections:    Talks on phone: Not on file    Gets together: Not on file    Attends religious service: Not on file    Active member of club or organization: Not on file    Attends meetings of clubs or organizations: Not on file    Relationship status:  Not on file  . Intimate partner violence:    Fear of current or ex partner: Not on file    Emotionally abused: Not on file    Physically abused: Not on file    Forced sexual activity: Not on file  Other Topics Concern  . Not on file  Social History Narrative  . Not on file     BP 116/80   Pulse 86   Ht 6\' 2"  (1.88 m)   Wt 234 lb (106.1 kg)   SpO2 97%   BMI 30.04 kg/m   Physical Exam:  Well appearing NAD HEENT: Unremarkable Neck:  No JVD, no thyromegally Lymphatics:  No adenopathy Back:  No CVA tenderness Lungs:  Clear with no wheezes HEART:  Regular rate rhythm, no murmurs, no rubs, no clicks Abd:  soft, positive bowel sounds, no organomegally, no rebound, no guarding Ext:  2 plus pulses, no edema, no cyanosis, no clubbing Skin:  No rashes no nodules Neuro:  CN II through XII intact,  motor grossly intact  EKG - nsr with pre-excitation  Assess/Plan: 1. WPW - he is s/p ablation but has not had any recurrent SVT documented. He appears to lose AP conduction at faster heart rates. I suspect his pathway has been modified. I have recommended he undergo exercise treadmill testing to see if he has exercise induced SVT and/or loss of pre-excitation.  2. Chest pain - his symptoms are atypical for CAD but he does have some risk factors. We will assess with GXT. 3. NSVT - he had occaisional brief episodes of NSVT.   Leonia Reeves.D.

## 2018-04-19 ENCOUNTER — Encounter: Payer: Self-pay | Admitting: *Deleted

## 2018-04-19 ENCOUNTER — Other Ambulatory Visit (HOSPITAL_COMMUNITY)
Admission: RE | Admit: 2018-04-19 | Discharge: 2018-04-19 | Disposition: A | Discharge status: Home / Self Care | Payer: Managed Care, Other (non HMO) | Source: Ambulatory Visit | Attending: Internal Medicine | Admitting: Internal Medicine

## 2018-04-19 ENCOUNTER — Other Ambulatory Visit: Payer: Self-pay | Admitting: *Deleted

## 2018-04-19 ENCOUNTER — Ambulatory Visit (HOSPITAL_COMMUNITY)
Admission: RE | Admit: 2018-04-19 | Discharge: 2018-04-19 | Disposition: A | Discharge status: Home / Self Care | Payer: Managed Care, Other (non HMO) | Source: Ambulatory Visit | Attending: Internal Medicine | Admitting: Internal Medicine

## 2018-04-19 DIAGNOSIS — Z01810 Encounter for preprocedural cardiovascular examination: Secondary | ICD-10-CM | POA: Insufficient documentation

## 2018-04-19 DIAGNOSIS — I456 Pre-excitation syndrome: Secondary | ICD-10-CM | POA: Insufficient documentation

## 2018-04-19 DIAGNOSIS — R0789 Other chest pain: Secondary | ICD-10-CM | POA: Diagnosis not present

## 2018-04-19 LAB — BASIC METABOLIC PANEL
Anion gap: 7 (ref 5–15)
BUN: 12 mg/dL (ref 6–20)
CO2: 24 mmol/L (ref 22–32)
Calcium: 9.1 mg/dL (ref 8.9–10.3)
Chloride: 108 mmol/L (ref 98–111)
Creatinine, Ser: 1.06 mg/dL (ref 0.61–1.24)
GFR calc Af Amer: >60 mL/min (ref >60)
GFR calc non Af Amer: >60 mL/min (ref >60)
Glucose, Bld: 100 mg/dL — ABNORMAL HIGH (ref 70–99)
Potassium: 3.4 mmol/L — ABNORMAL LOW (ref 3.5–5.1)
SODIUM: 139 mmol/L (ref 135–145)

## 2018-04-19 LAB — EXERCISE TOLERANCE TEST
CSEPEDS: 0 s
Estimated workload: 10.1 METS
Exercise duration (min): 9 min
MPHR: 174 {beats}/min
Peak HR: 137 {beats}/min
Percent HR: 78 %
RPE: 15
Rest HR: 70 {beats}/min

## 2018-04-19 LAB — CBC WITH DIFFERENTIAL/PLATELET
Abs Immature Granulocytes: 0.02 10*3/uL (ref 0.00–0.07)
Basophils Absolute: 0 10*3/uL (ref 0.0–0.1)
Basophils Relative: 1 %
EOS PCT: 6 %
Eosinophils Absolute: 0.3 10*3/uL (ref 0.0–0.5)
HCT: 44.6 % (ref 39.0–52.0)
Hemoglobin: 14.7 g/dL (ref 13.0–17.0)
Immature Granulocytes: 0 %
LYMPHS PCT: 38 %
Lymphs Abs: 1.8 10*3/uL (ref 0.7–4.0)
MCH: 29.6 pg (ref 26.0–34.0)
MCHC: 33 g/dL (ref 30.0–36.0)
MCV: 89.7 fL (ref 80.0–100.0)
Monocytes Absolute: 0.6 10*3/uL (ref 0.1–1.0)
Monocytes Relative: 12 %
Neutro Abs: 2.1 10*3/uL (ref 1.7–7.7)
Neutrophils Relative %: 43 %
Platelets: 270 10*3/uL (ref 150–400)
RBC: 4.97 MIL/uL (ref 4.22–5.81)
RDW: 13.2 % (ref 11.5–15.5)
WBC: 4.8 10*3/uL (ref 4.0–10.5)
nRBC: 0 % (ref 0.0–0.2)

## 2018-04-23 ENCOUNTER — Telehealth: Payer: Self-pay | Admitting: *Deleted

## 2018-04-23 NOTE — Telephone Encounter (Addendum)
Pt contacted pre-catheterization scheduled at [facility] for: Wednesday April 24, 2018 2:30 PM Verified arrival time and place: [facility] Main Entrance A at: 12:30 PM  No solid food after midnight prior to cath, clear liquids until 5 AM day of procedure. Contrast allergy: no  AM meds can be  taken pre-cath with sip of water including: ASA 81 mg  Confirm patient has responsible person to drive home post procedure and observe 24 hours after arriving home-yes  Pt encouraged to increase intake of potassium rich foods.

## 2018-04-24 ENCOUNTER — Other Ambulatory Visit: Payer: Self-pay

## 2018-04-24 ENCOUNTER — Ambulatory Visit (HOSPITAL_COMMUNITY)
Admission: RE | Admit: 2018-04-24 | Discharge: 2018-04-24 | Disposition: A | Discharge status: Home / Self Care | Payer: Managed Care, Other (non HMO) | Attending: Interventional Cardiology | Admitting: Interventional Cardiology

## 2018-04-24 ENCOUNTER — Encounter (HOSPITAL_COMMUNITY)
Admission: RE | Disposition: A | Discharge status: Home / Self Care | Payer: Self-pay | Source: Home / Self Care | Attending: Interventional Cardiology

## 2018-04-24 DIAGNOSIS — M1990 Unspecified osteoarthritis, unspecified site: Secondary | ICD-10-CM | POA: Insufficient documentation

## 2018-04-24 DIAGNOSIS — R9439 Abnormal result of other cardiovascular function study: Secondary | ICD-10-CM | POA: Diagnosis present

## 2018-04-24 DIAGNOSIS — F17210 Nicotine dependence, cigarettes, uncomplicated: Secondary | ICD-10-CM | POA: Diagnosis not present

## 2018-04-24 DIAGNOSIS — Z79899 Other long term (current) drug therapy: Secondary | ICD-10-CM | POA: Diagnosis not present

## 2018-04-24 DIAGNOSIS — R0789 Other chest pain: Secondary | ICD-10-CM | POA: Diagnosis not present

## 2018-04-24 DIAGNOSIS — K219 Gastro-esophageal reflux disease without esophagitis: Secondary | ICD-10-CM | POA: Insufficient documentation

## 2018-04-24 DIAGNOSIS — I456 Pre-excitation syndrome: Secondary | ICD-10-CM | POA: Diagnosis present

## 2018-04-24 HISTORY — PX: LEFT HEART CATH AND CORONARY ANGIOGRAPHY: CATH118249

## 2018-04-24 LAB — BASIC METABOLIC PANEL
Anion gap: 8 (ref 5–15)
BUN: 11 mg/dL (ref 6–20)
CO2: 22 mmol/L (ref 22–32)
Calcium: 9.4 mg/dL (ref 8.9–10.3)
Chloride: 108 mmol/L (ref 98–111)
Creatinine, Ser: 1.11 mg/dL (ref 0.61–1.24)
GFR calc Af Amer: >60 mL/min (ref >60)
Glucose, Bld: 96 mg/dL (ref 70–99)
POTASSIUM: 4.1 mmol/L (ref 3.5–5.1)
Sodium: 138 mmol/L (ref 135–145)

## 2018-04-24 SURGERY — LEFT HEART CATH AND CORONARY ANGIOGRAPHY
Anesthesia: LOCAL

## 2018-04-24 MED ORDER — SODIUM CHLORIDE 0.9 % IV SOLN: INTRAVENOUS | Status: DC

## 2018-04-24 MED ORDER — SODIUM CHLORIDE 0.9 % WEIGHT BASED INFUSION
3.0000 mL/kg/h | INTRAVENOUS | Status: AC
  Administered 2018-04-24: 3 mL/kg/h via INTRAVENOUS

## 2018-04-24 MED ORDER — VERAPAMIL HCL 2.5 MG/ML IV SOLN
INTRAVENOUS | Status: AC
  Filled 2018-04-24: qty 2

## 2018-04-24 MED ORDER — ASPIRIN 81 MG PO CHEW
81.0000 mg | CHEWABLE_TABLET | ORAL | Status: AC
  Administered 2018-04-24: 81 mg via ORAL
  Filled 2018-04-24: qty 1

## 2018-04-24 MED ORDER — IOHEXOL 350 MG/ML SOLN
INTRAVENOUS | Status: DC | PRN
  Administered 2018-04-24: 55 mL via INTRACARDIAC

## 2018-04-24 MED ORDER — ACETAMINOPHEN 325 MG PO TABS: 650.0000 mg | ORAL_TABLET | ORAL | Status: DC | PRN

## 2018-04-24 MED ORDER — MIDAZOLAM HCL 2 MG/2ML IJ SOLN
INTRAMUSCULAR | Status: DC | PRN
  Administered 2018-04-24 (×2): 1 mg via INTRAVENOUS

## 2018-04-24 MED ORDER — HEPARIN SODIUM (PORCINE) 1000 UNIT/ML IJ SOLN
INTRAMUSCULAR | Status: DC | PRN
  Administered 2018-04-24: 5000 [IU] via INTRAVENOUS

## 2018-04-24 MED ORDER — FENTANYL CITRATE (PF) 100 MCG/2ML IJ SOLN
INTRAMUSCULAR | Status: DC | PRN
  Administered 2018-04-24 (×2): 25 ug via INTRAVENOUS

## 2018-04-24 MED ORDER — SODIUM CHLORIDE 0.9% FLUSH: 3.0000 mL | INTRAVENOUS | Status: DC | PRN

## 2018-04-24 MED ORDER — SODIUM CHLORIDE 0.9 % IV SOLN: 250.0000 mL | INTRAVENOUS | Status: DC | PRN

## 2018-04-24 MED ORDER — LIDOCAINE HCL (PF) 1 % IJ SOLN
INTRAMUSCULAR | Status: AC
  Filled 2018-04-24: qty 30

## 2018-04-24 MED ORDER — SODIUM CHLORIDE 0.9% FLUSH: 3.0000 mL | Freq: Two times a day (BID) | INTRAVENOUS | Status: DC

## 2018-04-24 MED ORDER — HEPARIN SODIUM (PORCINE) 1000 UNIT/ML IJ SOLN
INTRAMUSCULAR | Status: AC
  Filled 2018-04-24: qty 1

## 2018-04-24 MED ORDER — ONDANSETRON HCL 4 MG/2ML IJ SOLN: 4.0000 mg | Freq: Four times a day (QID) | INTRAMUSCULAR | Status: DC | PRN

## 2018-04-24 MED ORDER — FENTANYL CITRATE (PF) 100 MCG/2ML IJ SOLN
INTRAMUSCULAR | Status: AC
  Filled 2018-04-24: qty 2

## 2018-04-24 MED ORDER — HEPARIN (PORCINE) IN NACL 1000-0.9 UT/500ML-% IV SOLN
INTRAVENOUS | Status: AC
  Filled 2018-04-24: qty 1000

## 2018-04-24 MED ORDER — MIDAZOLAM HCL 2 MG/2ML IJ SOLN
INTRAMUSCULAR | Status: AC
  Filled 2018-04-24: qty 2

## 2018-04-24 MED ORDER — OXYCODONE HCL 5 MG PO TABS: 5.0000 mg | ORAL_TABLET | ORAL | Status: DC | PRN

## 2018-04-24 MED ORDER — LIDOCAINE HCL (PF) 1 % IJ SOLN
INTRAMUSCULAR | Status: DC | PRN
  Administered 2018-04-24: 2 mL via INTRADERMAL

## 2018-04-24 MED ORDER — SODIUM CHLORIDE 0.9 % WEIGHT BASED INFUSION: 1.0000 mL/kg/h | INTRAVENOUS | Status: DC

## 2018-04-24 MED ORDER — VERAPAMIL HCL 2.5 MG/ML IV SOLN
INTRAVENOUS | Status: DC | PRN
  Administered 2018-04-24: 10 mL via INTRA_ARTERIAL

## 2018-04-24 MED ORDER — HEPARIN (PORCINE) IN NACL 1000-0.9 UT/500ML-% IV SOLN
INTRAVENOUS | Status: DC | PRN
  Administered 2018-04-24 (×2): 500 mL

## 2018-04-24 SURGICAL SUPPLY — 10 items
CATH 5FR JL3.5 JR4 ANG PIG MP (CATHETERS) ×2 IMPLANT
DEVICE RAD COMP TR BAND LRG (VASCULAR PRODUCTS) ×2 IMPLANT
GLIDESHEATH SLEND A-KIT 6F 22G (SHEATH) ×2 IMPLANT
GUIDEWIRE INQWIRE 1.5J.035X260 (WIRE) ×1 IMPLANT
INQWIRE 1.5J .035X260CM (WIRE) ×2
KIT HEART LEFT (KITS) ×2 IMPLANT
PACK CARDIAC CATHETERIZATION (CUSTOM PROCEDURE TRAY) ×2 IMPLANT
SHEATH PROBE COVER 6X72 (BAG) ×2 IMPLANT
TRANSDUCER W/STOPCOCK (MISCELLANEOUS) ×2 IMPLANT
TUBING CIL FLEX 10 FLL-RA (TUBING) ×2 IMPLANT

## 2018-04-24 NOTE — CV Procedure (Addendum)
   Coronary angiography via right radial using ultrasound guidance for access.  Normal coronary arteries.  Normal LVEDP.  LVEF could not be estimated. Poor opacification of the LV occurred with hand-injection.  False positive electrocardiographic response to exercise.

## 2018-04-24 NOTE — Discharge Instructions (Signed)
Radial Site Care ° °This sheet gives you information about how to care for yourself after your procedure. Your health care provider may also give you more specific instructions. If you have problems or questions, contact your health care provider. °What can I expect after the procedure? °After the procedure, it is common to have: °· Bruising and tenderness at the catheter insertion area. °Follow these instructions at home: °Medicines °· Take over-the-counter and prescription medicines only as told by your health care provider. °Insertion site care °· Follow instructions from your health care provider about how to take care of your insertion site. Make sure you: °? Wash your hands with soap and water before you change your bandage (dressing). If soap and water are not available, use hand sanitizer. °? Change your dressing as told by your health care provider. °? Leave stitches (sutures), skin glue, or adhesive strips in place. These skin closures may need to stay in place for 2 weeks or longer. If adhesive strip edges start to loosen and curl up, you may trim the loose edges. Do not remove adhesive strips completely unless your health care provider tells you to do that. °· Check your insertion site every day for signs of infection. Check for: °? Redness, swelling, or pain. °? Fluid or blood. °? Pus or a bad smell. °? Warmth. °· Do not take baths, swim, or use a hot tub until your health care provider approves. °· You may shower 24-48 hours after the procedure, or as directed by your health care provider. °? Remove the dressing and gently wash the site with plain soap and water. °? Pat the area dry with a clean towel. °? Do not rub the site. That could cause bleeding. °· Do not apply powder or lotion to the site. °Activity ° °· For 24 hours after the procedure, or as directed by your health care provider: °? Do not flex or bend the affected arm. °? Do not push or pull heavy objects with the affected arm. °? Do not  drive yourself home from the hospital or clinic. You may drive 24 hours after the procedure unless your health care provider tells you not to. °? Do not operate machinery or power tools. °· Do not lift anything that is heavier than 10 lb (4.5 kg), or the limit that you are told, until your health care provider says that it is safe. °· Ask your health care provider when it is okay to: °? Return to work or school. °? Resume usual physical activities or sports. °? Resume sexual activity. °General instructions °· If the catheter site starts to bleed, raise your arm and put firm pressure on the site. If the bleeding does not stop, get help right away. This is a medical emergency. °· If you went home on the same day as your procedure, a responsible adult should be with you for the first 24 hours after you arrive home. °· Keep all follow-up visits as told by your health care provider. This is important. °Contact a health care provider if: °· You have a fever. °· You have redness, swelling, or yellow drainage around your insertion site. °Get help right away if: °· You have unusual pain at the radial site. °· The catheter insertion area swells very fast. °· The insertion area is bleeding, and the bleeding does not stop when you hold steady pressure on the area. °· Your arm or hand becomes pale, cool, tingly, or numb. °These symptoms may represent a serious problem   that is an emergency. Do not wait to see if the symptoms will go away. Get medical help right away. Call your local emergency services (911 in the [place] Do not drive yourself to the hospital. °Summary °· After the procedure, it is common to have bruising and tenderness at the site. °· Follow instructions from your health care provider about how to take care of your radial site wound. Check the wound every day for signs of infection. °· Do not lift anything that is heavier than 10 lb (4.5 kg), or the limit that you are told, until your health care provider says  that it is safe. °This information is not intended to replace advice given to you by your health care provider. Make sure you discuss any questions you have with your health care provider. °Document Released: 03/11/2010 Document Revised: 03/14/2017 Document Reviewed: 03/14/2017 °[employer]vier Interactive Patient Education © 2019 [employer] ° °

## 2018-04-24 NOTE — Interval H&P Note (Signed)
Cath Lab Visit (complete for each Cath Lab visit)  Clinical Evaluation Leading to the Procedure:   ACS: No.  Non-ACS:    Anginal Classification: CCS II  Anti-ischemic medical therapy: Minimal Therapy (1 class of medications)  Non-Invasive Test Results: Low-risk stress test findings: cardiac mortality <1%/year  Prior CABG: No previous CABG      History and Physical Interval Note:  04/24/2018 2:50 PM  Casey Brown  has presented today for surgery, with the diagnosis of AST  The various methods of treatment have been discussed with the patient and family. After consideration of risks, benefits and other options for treatment, the patient has consented to  Procedure(s): LEFT HEART CATH AND CORONARY ANGIOGRAPHY (N/A) as a surgical intervention .  The patient's history has been reviewed, patient examined, no change in status, stable for surgery.  I have reviewed the patient's chart and labs.  Questions were answered to the patient's satisfaction.     Lyn Records III

## 2018-04-25 ENCOUNTER — Encounter (HOSPITAL_COMMUNITY): Payer: Self-pay | Admitting: Interventional Cardiology

## 2018-06-19 ENCOUNTER — Ambulatory Visit: Payer: Managed Care, Other (non HMO) | Admitting: Student

## 2020-10-07 IMAGING — CR DG CHEST 1V PORT
1 series · 1 of 1 positions shown · non-contrast
Comparison: None.

CLINICAL DATA: Chest pain

EXAM:
PORTABLE CHEST 1 VIEW

[portable]
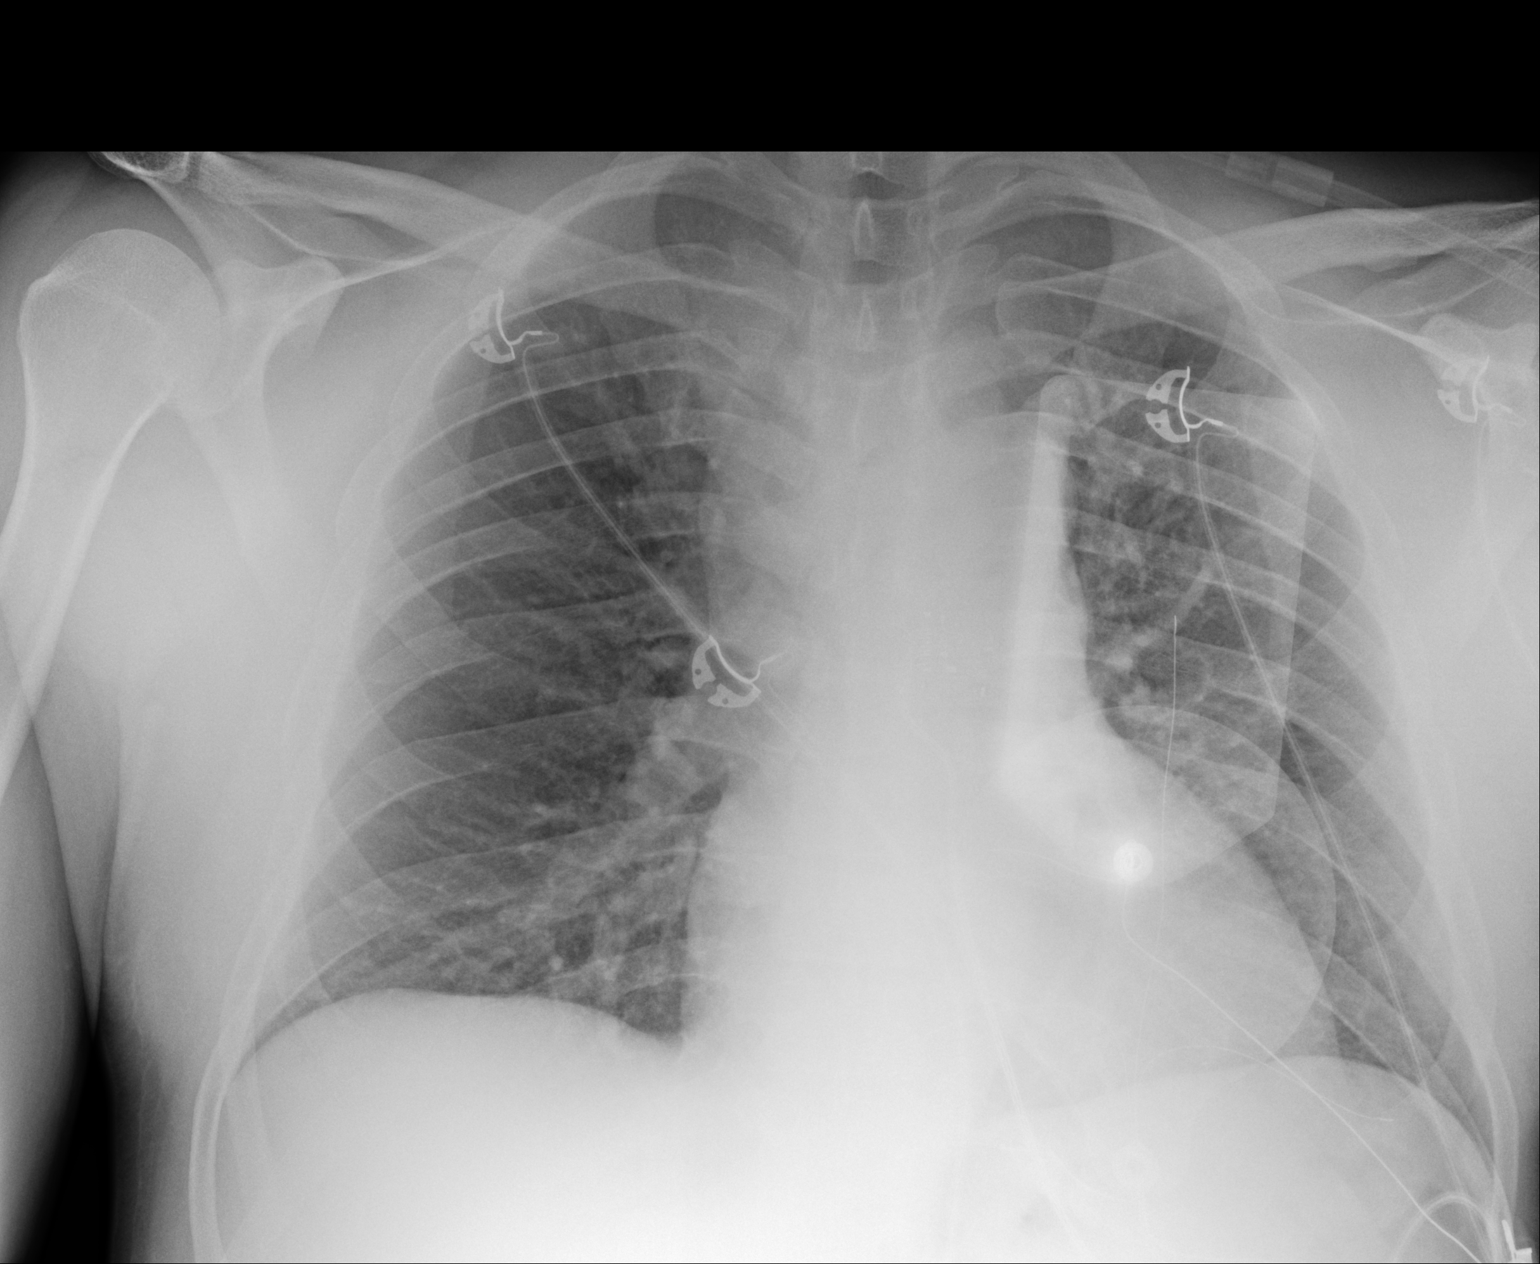

[1 of 1 positions shown; findings below may reference images not displayed]

FINDINGS: Pacer pads overlie the left chest. Normal heart size. Normal
mediastinal contour. No pneumothorax. No pleural effusion. Lungs
appear clear, with no acute consolidative airspace disease and no
pulmonary edema.
IMPRESSION: No active disease.

## 2020-10-20 IMAGING — DX DG CHEST 2V
2 series · 2 of 2 positions shown · non-contrast
Comparison: Radiograph February 04, 2018.

CLINICAL DATA: Chest pain.

EXAM:
CHEST - 2 VIEW

[chest pa]
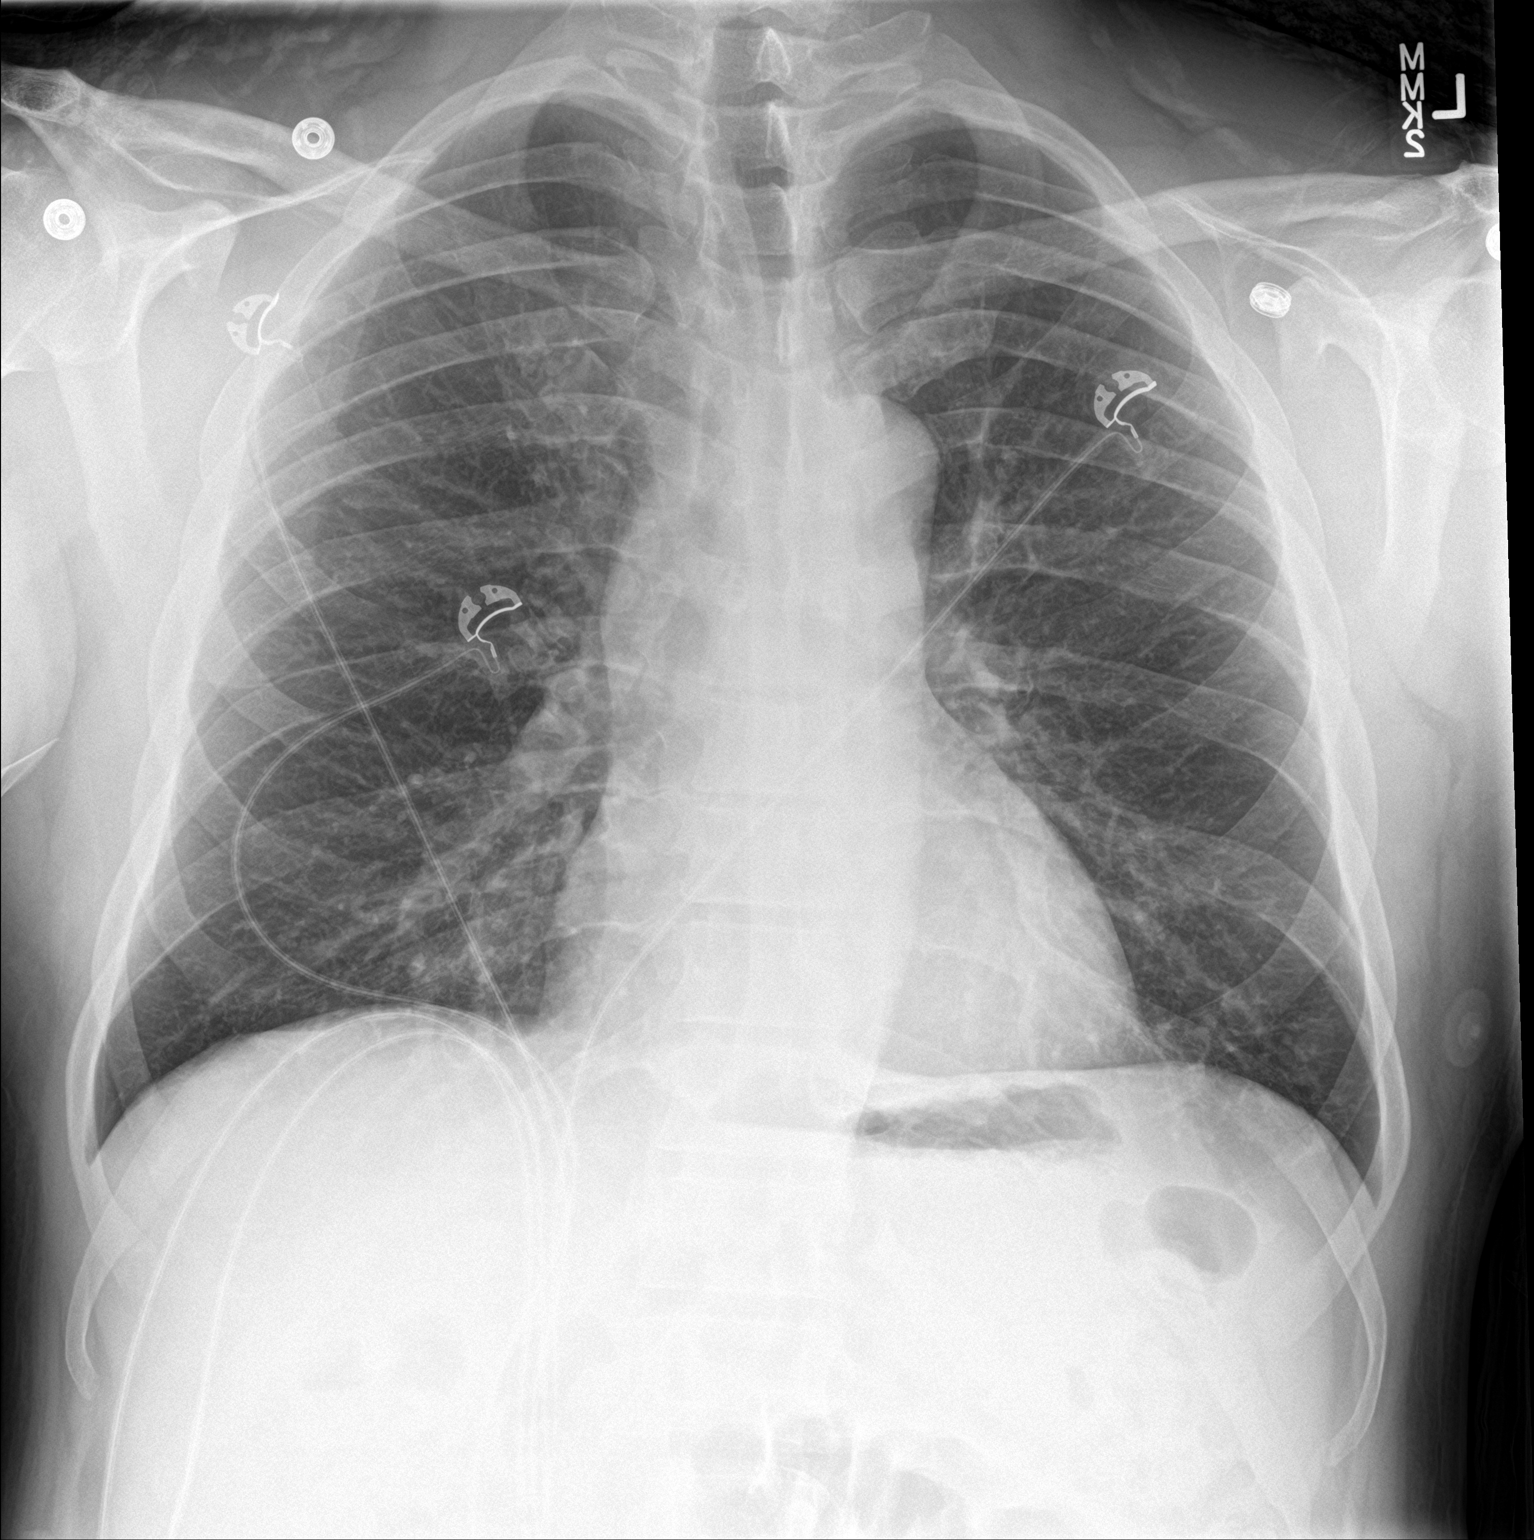

[chest lat]
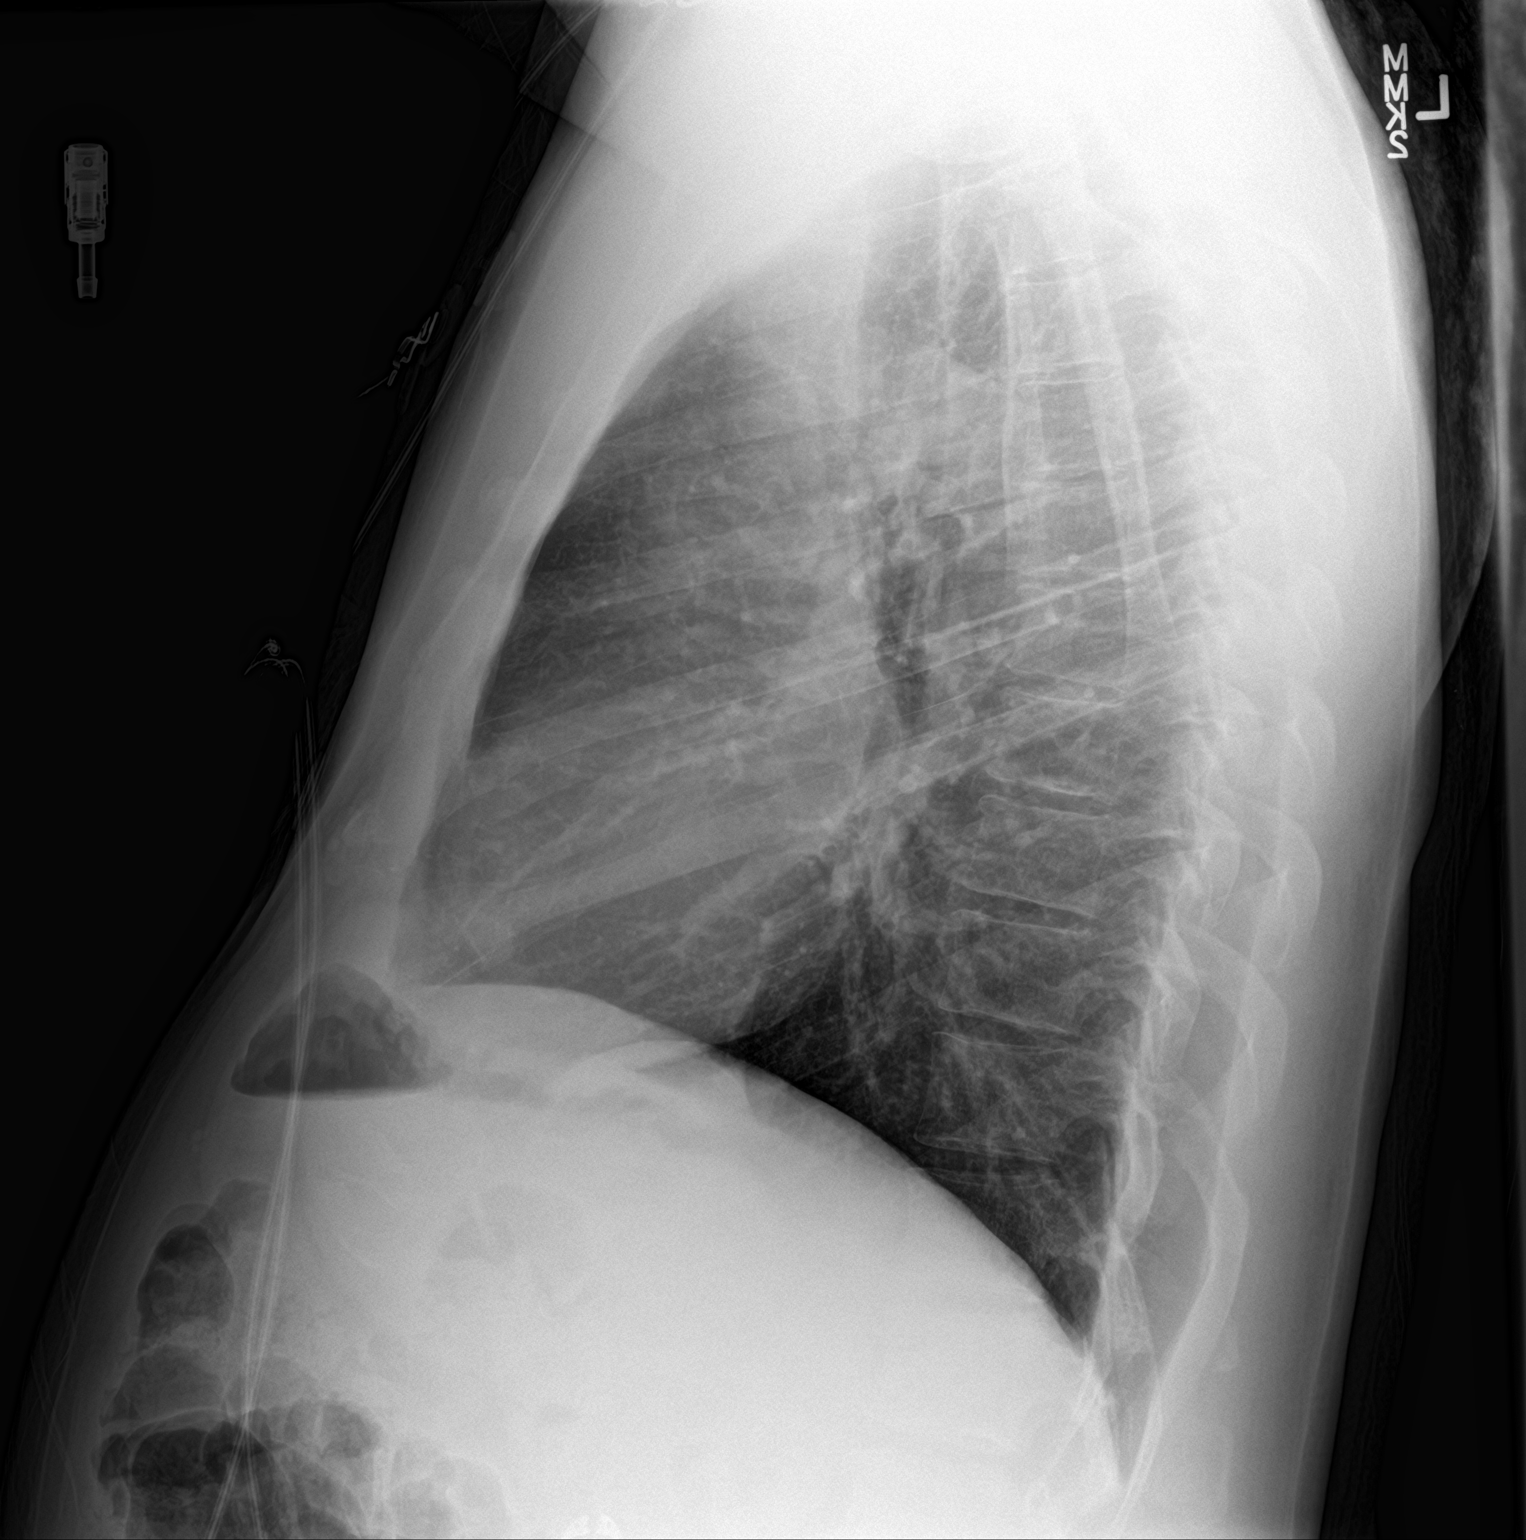

[2 of 2 positions shown; findings below may reference images not displayed]

FINDINGS: The heart size and mediastinal contours are within normal limits.
Both lungs are clear. No pneumothorax or pleural effusion is noted.
The visualized skeletal structures are unremarkable.
IMPRESSION: No active cardiopulmonary disease.
